# Patient Record
Sex: Male | Born: 1950 | Hispanic: Yes | Marital: Married | Smoking: Light tobacco smoker
Health system: Southern US, Community
[De-identification: ages and names within clinical notes are randomized; demographics above are authoritative.]

## PROBLEM LIST (undated history)

## (undated) DIAGNOSIS — K219 Gastro-esophageal reflux disease without esophagitis: Secondary | ICD-10-CM

## (undated) DIAGNOSIS — M1990 Unspecified osteoarthritis, unspecified site: Secondary | ICD-10-CM

## (undated) HISTORY — PX: TOTAL HIP ARTHROPLASTY: SHX124

## (undated) HISTORY — PX: POLYPECTOMY: SHX149

## (undated) HISTORY — PX: COLONOSCOPY: SHX174

## (undated) HISTORY — PX: KNEE ARTHROSCOPY: SHX127

## (undated) HISTORY — PX: TOTAL SHOULDER REPLACEMENT: SUR1217

---

## 2005-06-06 ENCOUNTER — Ambulatory Visit: Payer: Self-pay | Admitting: Nurse Practitioner

## 2005-06-12 ENCOUNTER — Ambulatory Visit: Payer: Self-pay | Admitting: Physician Assistant

## 2006-12-11 ENCOUNTER — Ambulatory Visit: Payer: Self-pay | Admitting: Neurology

## 2007-01-07 ENCOUNTER — Ambulatory Visit: Payer: Self-pay | Admitting: Unknown Physician Specialty

## 2008-08-15 ENCOUNTER — Ambulatory Visit: Payer: Self-pay

## 2008-10-10 ENCOUNTER — Ambulatory Visit: Payer: Self-pay | Admitting: Gastroenterology

## 2008-10-17 ENCOUNTER — Ambulatory Visit: Payer: Self-pay | Admitting: Gastroenterology

## 2009-07-23 ENCOUNTER — Ambulatory Visit: Payer: Self-pay | Admitting: Cardiology

## 2009-08-24 ENCOUNTER — Ambulatory Visit: Payer: Self-pay | Admitting: Internal Medicine

## 2010-12-04 ENCOUNTER — Ambulatory Visit: Payer: Self-pay | Admitting: Internal Medicine

## 2010-12-27 ENCOUNTER — Ambulatory Visit: Payer: Self-pay

## 2011-01-23 ENCOUNTER — Ambulatory Visit: Payer: Self-pay | Admitting: Pain Medicine

## 2011-02-10 ENCOUNTER — Ambulatory Visit: Payer: Self-pay | Admitting: Pain Medicine

## 2011-02-11 ENCOUNTER — Ambulatory Visit: Payer: Self-pay | Admitting: Pain Medicine

## 2011-03-06 ENCOUNTER — Ambulatory Visit: Payer: Self-pay | Admitting: Pain Medicine

## 2011-03-24 ENCOUNTER — Ambulatory Visit: Payer: Self-pay | Admitting: Pain Medicine

## 2011-04-08 ENCOUNTER — Ambulatory Visit: Payer: Self-pay | Admitting: Internal Medicine

## 2011-05-14 HISTORY — PX: LUMBAR DISC SURGERY: SHX700

## 2011-06-11 ENCOUNTER — Ambulatory Visit: Payer: Self-pay | Admitting: Unknown Physician Specialty

## 2011-06-13 ENCOUNTER — Inpatient Hospital Stay: Payer: Self-pay | Admitting: Unknown Physician Specialty

## 2013-01-05 ENCOUNTER — Ambulatory Visit: Payer: Self-pay | Admitting: Internal Medicine

## 2013-02-04 ENCOUNTER — Ambulatory Visit: Payer: Self-pay | Admitting: Gastroenterology

## 2013-08-04 ENCOUNTER — Ambulatory Visit: Payer: Self-pay | Admitting: Rheumatology

## 2014-01-12 ENCOUNTER — Ambulatory Visit: Payer: Self-pay | Admitting: Gastroenterology

## 2016-01-03 ENCOUNTER — Other Ambulatory Visit: Payer: Self-pay | Admitting: Internal Medicine

## 2016-01-03 DIAGNOSIS — M5137 Other intervertebral disc degeneration, lumbosacral region: Secondary | ICD-10-CM

## 2016-01-22 NOTE — Discharge Instructions (Signed)

## 2016-01-23 ENCOUNTER — Ambulatory Visit
Admission: RE | Admit: 2016-01-23 | Discharge: 2016-01-23 | Disposition: A | Discharge status: Home / Self Care | Payer: Medicare Other | Source: Ambulatory Visit | Attending: Internal Medicine | Admitting: Internal Medicine

## 2016-01-23 ENCOUNTER — Ambulatory Visit: Admission: RE | Admit: 2016-01-23 | Payer: Medicare Other | Source: Ambulatory Visit | Admitting: Surgery

## 2016-01-23 ENCOUNTER — Encounter: Admission: RE | Payer: Self-pay | Source: Ambulatory Visit

## 2016-01-23 DIAGNOSIS — M5137 Other intervertebral disc degeneration, lumbosacral region: Secondary | ICD-10-CM | POA: Diagnosis not present

## 2016-01-23 DIAGNOSIS — M4806 Spinal stenosis, lumbar region: Secondary | ICD-10-CM | POA: Insufficient documentation

## 2016-01-23 DIAGNOSIS — M2548 Effusion, other site: Secondary | ICD-10-CM | POA: Diagnosis not present

## 2016-01-23 DIAGNOSIS — M5126 Other intervertebral disc displacement, lumbar region: Secondary | ICD-10-CM | POA: Insufficient documentation

## 2016-01-23 DIAGNOSIS — Z9889 Other specified postprocedural states: Secondary | ICD-10-CM | POA: Insufficient documentation

## 2016-01-23 SURGERY — RELEASE, DUPUYTREN CONTRACTURE
Anesthesia: Choice | Laterality: Right

## 2017-02-13 ENCOUNTER — Other Ambulatory Visit: Payer: Self-pay | Admitting: Internal Medicine

## 2017-02-13 DIAGNOSIS — M7581 Other shoulder lesions, right shoulder: Secondary | ICD-10-CM

## 2017-02-13 DIAGNOSIS — M19011 Primary osteoarthritis, right shoulder: Secondary | ICD-10-CM

## 2017-02-25 ENCOUNTER — Ambulatory Visit
Admission: RE | Admit: 2017-02-25 | Discharge: 2017-02-25 | Disposition: A | Discharge status: Home / Self Care | Payer: Medicare Other | Source: Ambulatory Visit | Attending: Internal Medicine | Admitting: Internal Medicine

## 2017-02-25 DIAGNOSIS — M7551 Bursitis of right shoulder: Secondary | ICD-10-CM | POA: Insufficient documentation

## 2017-02-25 DIAGNOSIS — M19011 Primary osteoarthritis, right shoulder: Secondary | ICD-10-CM | POA: Diagnosis not present

## 2017-02-25 DIAGNOSIS — M7581 Other shoulder lesions, right shoulder: Secondary | ICD-10-CM | POA: Diagnosis not present

## 2017-02-25 DIAGNOSIS — M25411 Effusion, right shoulder: Secondary | ICD-10-CM | POA: Insufficient documentation

## 2017-02-25 DIAGNOSIS — M659 Synovitis and tenosynovitis, unspecified: Secondary | ICD-10-CM | POA: Diagnosis not present

## 2017-03-19 ENCOUNTER — Encounter
Admission: RE | Admit: 2017-03-19 | Discharge: 2017-03-19 | Disposition: A | Discharge status: Home / Self Care | Payer: Medicare Other | Source: Ambulatory Visit | Attending: Surgery | Admitting: Surgery

## 2017-03-19 DIAGNOSIS — M19011 Primary osteoarthritis, right shoulder: Secondary | ICD-10-CM | POA: Diagnosis not present

## 2017-03-19 DIAGNOSIS — Z01812 Encounter for preprocedural laboratory examination: Secondary | ICD-10-CM | POA: Insufficient documentation

## 2017-03-19 DIAGNOSIS — Z01818 Encounter for other preprocedural examination: Secondary | ICD-10-CM | POA: Insufficient documentation

## 2017-03-19 DIAGNOSIS — Z0183 Encounter for blood typing: Secondary | ICD-10-CM | POA: Insufficient documentation

## 2017-03-19 DIAGNOSIS — R001 Bradycardia, unspecified: Secondary | ICD-10-CM | POA: Diagnosis not present

## 2017-03-19 HISTORY — DX: Gastro-esophageal reflux disease without esophagitis: K21.9

## 2017-03-19 HISTORY — DX: Unspecified osteoarthritis, unspecified site: M19.90

## 2017-03-19 LAB — CBC
HEMATOCRIT: 37.2 % — AB (ref 40.0–52.0)
Hemoglobin: 13.3 g/dL (ref 13.0–18.0)
MCH: 31.9 pg (ref 26.0–34.0)
MCHC: 35.7 g/dL (ref 32.0–36.0)
MCV: 89.3 fL (ref 80.0–100.0)
Platelets: 227 10*3/uL (ref 150–440)
RBC: 4.17 MIL/uL — ABNORMAL LOW (ref 4.40–5.90)
RDW: 13.5 % (ref 11.5–14.5)
WBC: 4.6 10*3/uL (ref 3.8–10.6)

## 2017-03-19 LAB — TYPE AND SCREEN
ABO/RH(D): B NEG
Antibody Screen: NEGATIVE

## 2017-03-19 LAB — BASIC METABOLIC PANEL
Anion gap: 6 (ref 5–15)
BUN: 23 mg/dL — ABNORMAL HIGH (ref 6–20)
CALCIUM: 9 mg/dL (ref 8.9–10.3)
CHLORIDE: 106 mmol/L (ref 101–111)
CO2: 29 mmol/L (ref 22–32)
CREATININE: 0.65 mg/dL (ref 0.61–1.24)
GFR calc Af Amer: >60 mL/min (ref >60)
GFR calc non Af Amer: >60 mL/min (ref >60)
GLUCOSE: 85 mg/dL (ref 65–99)
Potassium: 4.4 mmol/L (ref 3.5–5.1)
Sodium: 141 mmol/L (ref 135–145)

## 2017-03-19 LAB — URINALYSIS, COMPLETE (UACMP) WITH MICROSCOPIC
Bacteria, UA: NONE SEEN
GLUCOSE, UA: NEGATIVE mg/dL
Hgb urine dipstick: NEGATIVE
KETONES UR: 5 mg/dL — AB
Leukocytes, UA: NEGATIVE
Nitrite: NEGATIVE
Protein, ur: NEGATIVE mg/dL
RBC / HPF: NONE SEEN RBC/hpf (ref 0–5)
SQUAMOUS EPITHELIAL / LPF: NONE SEEN
Specific Gravity, Urine: 1.036 — ABNORMAL HIGH (ref 1.005–1.030)
pH: 5 (ref 5.0–8.0)

## 2017-03-19 LAB — PROTIME-INR
INR: 1.02
Prothrombin Time: 13.4 seconds (ref 11.4–15.2)

## 2017-03-19 LAB — SURGICAL PCR SCREEN

## 2017-03-19 NOTE — Patient Instructions (Signed)
Your procedure is scheduled on: 03/26/17 Thurs Report to Same Day Surgery 2nd floor medical mall [facility](Medical Mall Entrance-take elevator on left to 2nd floor.  Check in with surgery information desk.) To find out your arrival time please call [phone] between 1PM - 3PM on 03/25/17 Wed Remember: Instructions that are not followed completely may result in serious medical risk, up to and including death, or upon the discretion of your surgeon and anesthesiologist your surgery may need to be rescheduled.    _x___ 1. Do not eat food or drink liquids after midnight. No gum chewing or                              hard candies.     __x__ 2. No Alcohol for 24 hours before or after surgery.   __x__3. No Smoking for 24 prior to surgery.   ____  4. Bring all medications with you on the day of surgery if instructed.    __x__ 5. Notify your doctor if there is any change in your medical condition     (cold, fever, infections).     Do not wear jewelry, make-up, hairpins, clips or nail polish.  Do not wear lotions, powders, or perfumes. You may wear deodorant.  Do not shave 48 hours prior to surgery. Men may shave face and neck.  Do not bring valuables to the hospital.    [facility] any belongings or valuables.               Contacts, dentures or bridgework may not be worn into surgery.  Leave your suitcase in the car. After surgery it may be brought to your room.  For patients admitted to the hospital, discharge time is determined by your                       treatment team.   Patients discharged the day of surgery will not be allowed to drive home.  You will need someone to drive you home and stay with you the night of your procedure.    Please read over the following fact sheets that you were given:   [facility] Pre[facility] and or MRSA Information   _x___ Take anti-hypertensive (unless it includes a diuretic), cardiac, seizure, asthma,     anti-reflux and  psychiatric medicines. These include:  1. gabapentin (NEURONTIN)  2.HYDROcodone-acetaminophen (NORCO  3.omeprazole (PRILOSEC)   4.  5.  6.  ____Fleets enema or Magnesium Citrate as directed.   _x___ Use CHG Soap or sage wipes as directed on instruction sheet   ____ Use inhalers on the day of surgery and bring to hospital day of surgery  ____ Stop Metformin and Janumet 2 days prior to surgery.    ____ Take 1/2 of usual insulin dose the night before surgery and none on the morning     surgery.   _x___ Follow recommendations from Cardiologist, Pulmonologist or PCP regarding          stopping Aspirin, Coumadin, Pllavix ,Eliquis, Effient, or Pradaxa, and Pletal.  X____Stop Anti-inflammatories such as Advil, Aleve, Ibuprofen, Motrin, Naproxen, Naprosyn, Goodies powders or aspirin products.  Stop Meloxicam today OK to take Tylenol and                          Celebrex.   _x___ Stop supplements until after surgery.  But may continue Vitamin D, Vitamin B,       and multivitamin.   ____ Bring C-Pap to the hospital.

## 2017-03-20 LAB — URINE CULTURE: Culture: NO GROWTH

## 2017-03-25 MED ORDER — CEFAZOLIN SODIUM-DEXTROSE 2-4 GM/100ML-% IV SOLN
2.0000 g | Freq: Once | INTRAVENOUS | Status: AC
  Administered 2017-03-26: 2 g via INTRAVENOUS

## 2017-03-26 ENCOUNTER — Inpatient Hospital Stay: Payer: Medicare Other | Admitting: Anesthesiology

## 2017-03-26 ENCOUNTER — Encounter: Payer: Self-pay | Admitting: *Deleted

## 2017-03-26 ENCOUNTER — Inpatient Hospital Stay: Payer: Medicare Other

## 2017-03-26 ENCOUNTER — Inpatient Hospital Stay
Admission: RE | Admit: 2017-03-26 | Discharge: 2017-03-27 | DRG: 483 | Disposition: A | Discharge status: Home / Self Care | Payer: Medicare Other | Source: Ambulatory Visit | Attending: Surgery | Admitting: Surgery

## 2017-03-26 ENCOUNTER — Encounter
Admission: RE | Disposition: A | Discharge status: Home / Self Care | Payer: Self-pay | Source: Ambulatory Visit | Attending: Surgery

## 2017-03-26 DIAGNOSIS — F17210 Nicotine dependence, cigarettes, uncomplicated: Secondary | ICD-10-CM | POA: Diagnosis present

## 2017-03-26 DIAGNOSIS — M720 Palmar fascial fibromatosis [Dupuytren]: Secondary | ICD-10-CM | POA: Diagnosis present

## 2017-03-26 DIAGNOSIS — M19011 Primary osteoarthritis, right shoulder: Principal | ICD-10-CM | POA: Diagnosis present

## 2017-03-26 DIAGNOSIS — Z79899 Other long term (current) drug therapy: Secondary | ICD-10-CM | POA: Diagnosis not present

## 2017-03-26 DIAGNOSIS — Z96611 Presence of right artificial shoulder joint: Secondary | ICD-10-CM

## 2017-03-26 DIAGNOSIS — K219 Gastro-esophageal reflux disease without esophagitis: Secondary | ICD-10-CM | POA: Diagnosis present

## 2017-03-26 HISTORY — PX: DUPUYTREN CONTRACTURE RELEASE: SHX1478

## 2017-03-26 HISTORY — PX: TOTAL SHOULDER ARTHROPLASTY: SHX126

## 2017-03-26 LAB — ABO/RH: ABO/RH(D): B NEG

## 2017-03-26 LAB — SURGICAL PCR SCREEN
MRSA, PCR: NEGATIVE
Staphylococcus aureus: POSITIVE — AB

## 2017-03-26 SURGERY — ARTHROPLASTY, SHOULDER, TOTAL
Anesthesia: Regional | Site: Shoulder | Laterality: Right

## 2017-03-26 MED ORDER — ONDANSETRON HCL 4 MG/2ML IJ SOLN
INTRAMUSCULAR | Status: AC
  Filled 2017-03-26: qty 2

## 2017-03-26 MED ORDER — ROPIVACAINE HCL 5 MG/ML IJ SOLN
INTRAMUSCULAR | Status: AC
  Filled 2017-03-26: qty 30

## 2017-03-26 MED ORDER — DEXAMETHASONE SODIUM PHOSPHATE 10 MG/ML IJ SOLN
INTRAMUSCULAR | Status: AC
  Filled 2017-03-26: qty 1

## 2017-03-26 MED ORDER — LACTATED RINGERS IV SOLN
INTRAVENOUS | Status: DC
  Administered 2017-03-26 (×3): via INTRAVENOUS

## 2017-03-26 MED ORDER — DOCUSATE SODIUM 100 MG PO CAPS
100.0000 mg | ORAL_CAPSULE | Freq: Two times a day (BID) | ORAL | Status: DC
  Administered 2017-03-26 – 2017-03-27 (×2): 100 mg via ORAL
  Filled 2017-03-26 (×2): qty 1

## 2017-03-26 MED ORDER — KETOROLAC TROMETHAMINE 15 MG/ML IJ SOLN
15.0000 mg | Freq: Four times a day (QID) | INTRAMUSCULAR | Status: DC
  Administered 2017-03-26 – 2017-03-27 (×3): 15 mg via INTRAVENOUS
  Filled 2017-03-26 (×3): qty 1

## 2017-03-26 MED ORDER — EPINEPHRINE PF 1 MG/ML IJ SOLN
INTRAMUSCULAR | Status: AC
  Filled 2017-03-26: qty 1

## 2017-03-26 MED ORDER — BUPIVACAINE HCL (PF) 0.5 % IJ SOLN
INTRAMUSCULAR | Status: AC
Start: 2017-03-26 — End: 2017-03-26
  Filled 2017-03-26: qty 30

## 2017-03-26 MED ORDER — ONDANSETRON HCL 4 MG/2ML IJ SOLN: 4.0000 mg | Freq: Once | INTRAMUSCULAR | Status: DC | PRN

## 2017-03-26 MED ORDER — BUPIVACAINE-EPINEPHRINE (PF) 0.5% -1:200000 IJ SOLN
INTRAMUSCULAR | Status: AC
  Filled 2017-03-26: qty 30

## 2017-03-26 MED ORDER — ACETAMINOPHEN 10 MG/ML IV SOLN
INTRAVENOUS | Status: DC | PRN
  Administered 2017-03-26: 1000 mg via INTRAVENOUS

## 2017-03-26 MED ORDER — KETOROLAC TROMETHAMINE 30 MG/ML IJ SOLN
INTRAMUSCULAR | Status: AC
  Filled 2017-03-26: qty 1

## 2017-03-26 MED ORDER — MIDAZOLAM HCL 2 MG/2ML IJ SOLN
INTRAMUSCULAR | Status: AC
  Filled 2017-03-26: qty 2

## 2017-03-26 MED ORDER — BUPIVACAINE HCL (PF) 0.5 % IJ SOLN
INTRAMUSCULAR | Status: DC | PRN
  Administered 2017-03-26: 10 mL

## 2017-03-26 MED ORDER — KCL IN DEXTROSE-NACL 20-5-0.9 MEQ/L-%-% IV SOLN
INTRAVENOUS | Status: DC
  Administered 2017-03-26 – 2017-03-27 (×2): via INTRAVENOUS
  Filled 2017-03-26 (×4): qty 1000

## 2017-03-26 MED ORDER — MAGNESIUM HYDROXIDE 400 MG/5ML PO SUSP: 30.0000 mL | Freq: Every day | ORAL | Status: DC | PRN

## 2017-03-26 MED ORDER — ROPIVACAINE HCL 5 MG/ML IJ SOLN
INTRAMUSCULAR | Status: DC | PRN
  Administered 2017-03-26: 30 mL via PERINEURAL

## 2017-03-26 MED ORDER — FENTANYL CITRATE (PF) 100 MCG/2ML IJ SOLN
INTRAMUSCULAR | Status: AC
  Filled 2017-03-26: qty 2

## 2017-03-26 MED ORDER — LIDOCAINE HCL (PF) 2 % IJ SOLN
INTRAMUSCULAR | Status: AC
Start: 2017-03-26 — End: 2017-03-26
  Filled 2017-03-26: qty 2

## 2017-03-26 MED ORDER — SODIUM CHLORIDE 0.9 % IJ SOLN
INTRAMUSCULAR | Status: AC
  Filled 2017-03-26: qty 50

## 2017-03-26 MED ORDER — LIDOCAINE HCL (CARDIAC) 20 MG/ML IV SOLN
INTRAVENOUS | Status: DC | PRN
  Administered 2017-03-26: 40 mg via INTRAVENOUS

## 2017-03-26 MED ORDER — ACETAMINOPHEN 500 MG PO TABS
1000.0000 mg | ORAL_TABLET | Freq: Four times a day (QID) | ORAL | Status: DC
  Administered 2017-03-27 (×2): 1000 mg via ORAL
  Filled 2017-03-26 (×2): qty 2

## 2017-03-26 MED ORDER — FENTANYL CITRATE (PF) 250 MCG/5ML IJ SOLN
INTRAMUSCULAR | Status: AC
  Filled 2017-03-26: qty 5

## 2017-03-26 MED ORDER — FERROUS SULFATE 325 (65 FE) MG PO TABS
325.0000 mg | ORAL_TABLET | Freq: Every day | ORAL | Status: DC
  Administered 2017-03-27: 325 mg via ORAL
  Filled 2017-03-26 (×2): qty 1

## 2017-03-26 MED ORDER — ROCURONIUM BROMIDE 100 MG/10ML IV SOLN
INTRAVENOUS | Status: DC | PRN
  Administered 2017-03-26: 10 mg via INTRAVENOUS
  Administered 2017-03-26: 50 mg via INTRAVENOUS
  Administered 2017-03-26: 15 mg via INTRAVENOUS
  Administered 2017-03-26: 20 mg via INTRAVENOUS

## 2017-03-26 MED ORDER — HYDROMORPHONE HCL 1 MG/ML IJ SOLN: 1.0000 mg | INTRAMUSCULAR | Status: DC | PRN

## 2017-03-26 MED ORDER — GABAPENTIN 300 MG PO CAPS
300.0000 mg | ORAL_CAPSULE | Freq: Three times a day (TID) | ORAL | Status: DC
  Administered 2017-03-26 – 2017-03-27 (×2): 300 mg via ORAL
  Filled 2017-03-26 (×2): qty 1

## 2017-03-26 MED ORDER — ENOXAPARIN SODIUM 40 MG/0.4ML ~~LOC~~ SOLN
40.0000 mg | SUBCUTANEOUS | Status: DC
  Administered 2017-03-27: 40 mg via SUBCUTANEOUS
  Filled 2017-03-26: qty 0.4

## 2017-03-26 MED ORDER — ACETAMINOPHEN 10 MG/ML IV SOLN
INTRAVENOUS | Status: AC
  Filled 2017-03-26: qty 100

## 2017-03-26 MED ORDER — LIDOCAINE HCL (PF) 1 % IJ SOLN
INTRAMUSCULAR | Status: AC
  Filled 2017-03-26: qty 5

## 2017-03-26 MED ORDER — ONDANSETRON HCL 4 MG/2ML IJ SOLN
INTRAMUSCULAR | Status: DC | PRN
  Administered 2017-03-26: 4 mg via INTRAVENOUS

## 2017-03-26 MED ORDER — KETOROLAC TROMETHAMINE 30 MG/ML IJ SOLN
30.0000 mg | Freq: Once | INTRAMUSCULAR | Status: AC
  Administered 2017-03-26: 30 mg via INTRAVENOUS

## 2017-03-26 MED ORDER — VITAMIN D 1000 UNITS PO TABS
2000.0000 [IU] | ORAL_TABLET | ORAL | Status: DC
  Administered 2017-03-27: 2000 [IU] via ORAL
  Filled 2017-03-26: qty 2

## 2017-03-26 MED ORDER — BUPIVACAINE-EPINEPHRINE (PF) 0.5% -1:200000 IJ SOLN
INTRAMUSCULAR | Status: DC | PRN
Start: 2017-03-26 — End: 2017-03-26
  Administered 2017-03-26: 30 mL

## 2017-03-26 MED ORDER — SUGAMMADEX SODIUM 200 MG/2ML IV SOLN
INTRAVENOUS | Status: DC | PRN
  Administered 2017-03-26: 160 mg via INTRAVENOUS

## 2017-03-26 MED ORDER — PROPOFOL 10 MG/ML IV BOLUS
INTRAVENOUS | Status: DC | PRN
  Administered 2017-03-26: 110 mg via INTRAVENOUS

## 2017-03-26 MED ORDER — CEFAZOLIN SODIUM-DEXTROSE 2-4 GM/100ML-% IV SOLN
INTRAVENOUS | Status: AC
  Filled 2017-03-26: qty 100

## 2017-03-26 MED ORDER — FENTANYL CITRATE (PF) 100 MCG/2ML IJ SOLN: 25.0000 ug | INTRAMUSCULAR | Status: DC | PRN

## 2017-03-26 MED ORDER — PROPOFOL 10 MG/ML IV BOLUS
INTRAVENOUS | Status: AC
  Filled 2017-03-26: qty 20

## 2017-03-26 MED ORDER — DIPHENHYDRAMINE HCL 12.5 MG/5ML PO ELIX: 12.5000 mg | ORAL_SOLUTION | ORAL | Status: DC | PRN

## 2017-03-26 MED ORDER — BUPIVACAINE LIPOSOME 1.3 % IJ SUSP
INTRAMUSCULAR | Status: AC
  Filled 2017-03-26: qty 20

## 2017-03-26 MED ORDER — ACETAMINOPHEN 650 MG RE SUPP: 650.0000 mg | Freq: Four times a day (QID) | RECTAL | Status: DC | PRN

## 2017-03-26 MED ORDER — FLEET ENEMA 7-19 GM/118ML RE ENEM: 1.0000 | ENEMA | Freq: Once | RECTAL | Status: DC | PRN

## 2017-03-26 MED ORDER — DEXAMETHASONE SODIUM PHOSPHATE 10 MG/ML IJ SOLN
INTRAMUSCULAR | Status: DC | PRN
  Administered 2017-03-26: 8 mg via INTRAVENOUS

## 2017-03-26 MED ORDER — SUGAMMADEX SODIUM 200 MG/2ML IV SOLN
INTRAVENOUS | Status: AC
  Filled 2017-03-26: qty 2

## 2017-03-26 MED ORDER — PANTOPRAZOLE SODIUM 40 MG PO TBEC
40.0000 mg | DELAYED_RELEASE_TABLET | Freq: Every day | ORAL | Status: DC
  Administered 2017-03-26 – 2017-03-27 (×2): 40 mg via ORAL
  Filled 2017-03-26 (×2): qty 1

## 2017-03-26 MED ORDER — MIDAZOLAM HCL 5 MG/5ML IJ SOLN
INTRAMUSCULAR | Status: AC
  Filled 2017-03-26: qty 5

## 2017-03-26 MED ORDER — LIDOCAINE HCL (PF) 1 % IJ SOLN
INTRAMUSCULAR | Status: DC | PRN
  Administered 2017-03-26: .5 mL via SUBCUTANEOUS

## 2017-03-26 MED ORDER — CEFAZOLIN SODIUM-DEXTROSE 2-4 GM/100ML-% IV SOLN
2.0000 g | Freq: Four times a day (QID) | INTRAVENOUS | Status: AC
  Administered 2017-03-26 – 2017-03-27 (×3): 2 g via INTRAVENOUS
  Filled 2017-03-26 (×3): qty 100

## 2017-03-26 MED ORDER — ONDANSETRON HCL 4 MG/2ML IJ SOLN: 4.0000 mg | Freq: Four times a day (QID) | INTRAMUSCULAR | Status: DC | PRN

## 2017-03-26 MED ORDER — BUPIVACAINE LIPOSOME 1.3 % IJ SUSP
INTRAMUSCULAR | Status: DC | PRN
  Administered 2017-03-26: 20 mL

## 2017-03-26 MED ORDER — FENTANYL CITRATE (PF) 100 MCG/2ML IJ SOLN
INTRAMUSCULAR | Status: DC | PRN
  Administered 2017-03-26 (×3): 50 ug via INTRAVENOUS
  Administered 2017-03-26: 100 ug via INTRAVENOUS
  Administered 2017-03-26 (×2): 25 ug via INTRAVENOUS

## 2017-03-26 MED ORDER — ROCURONIUM BROMIDE 50 MG/5ML IV SOLN
INTRAVENOUS | Status: AC
  Filled 2017-03-26: qty 1

## 2017-03-26 MED ORDER — ONDANSETRON HCL 4 MG PO TABS: 4.0000 mg | ORAL_TABLET | Freq: Four times a day (QID) | ORAL | Status: DC | PRN

## 2017-03-26 MED ORDER — PHENYLEPHRINE HCL 10 MG/ML IJ SOLN
INTRAMUSCULAR | Status: AC
  Filled 2017-03-26: qty 1

## 2017-03-26 MED ORDER — VITAMIN B-12 1000 MCG PO TABS
1000.0000 ug | ORAL_TABLET | Freq: Every day | ORAL | Status: DC
  Administered 2017-03-27: 1000 ug via ORAL
  Filled 2017-03-26: qty 1

## 2017-03-26 MED ORDER — ACETAMINOPHEN 325 MG PO TABS: 650.0000 mg | ORAL_TABLET | Freq: Four times a day (QID) | ORAL | Status: DC | PRN

## 2017-03-26 MED ORDER — FENTANYL CITRATE (PF) 100 MCG/2ML IJ SOLN
25.0000 ug | INTRAMUSCULAR | Status: DC | PRN
  Administered 2017-03-26 (×4): 25 ug via INTRAVENOUS

## 2017-03-26 MED ORDER — MIDAZOLAM HCL 2 MG/2ML IJ SOLN
2.0000 mg | Freq: Once | INTRAMUSCULAR | Status: AC
  Administered 2017-03-26: 2 mg via INTRAVENOUS

## 2017-03-26 MED ORDER — METOCLOPRAMIDE HCL 10 MG PO TABS: 5.0000 mg | ORAL_TABLET | Freq: Three times a day (TID) | ORAL | Status: DC | PRN

## 2017-03-26 MED ORDER — PHENYLEPHRINE HCL 10 MG/ML IJ SOLN
INTRAMUSCULAR | Status: DC | PRN
  Administered 2017-03-26: 2 mL via TOPICAL

## 2017-03-26 MED ORDER — EPHEDRINE SULFATE 50 MG/ML IJ SOLN
INTRAMUSCULAR | Status: DC | PRN
  Administered 2017-03-26: 10 mg via INTRAVENOUS
  Administered 2017-03-26: 15 mg via INTRAVENOUS

## 2017-03-26 MED ORDER — NEOMYCIN-POLYMYXIN B GU 40-200000 IR SOLN
Status: AC
  Filled 2017-03-26: qty 20

## 2017-03-26 MED ORDER — BISACODYL 10 MG RE SUPP: 10.0000 mg | Freq: Every day | RECTAL | Status: DC | PRN

## 2017-03-26 MED ORDER — FENTANYL CITRATE (PF) 100 MCG/2ML IJ SOLN
50.0000 ug | Freq: Once | INTRAMUSCULAR | Status: AC
  Administered 2017-03-26: 50 ug via INTRAVENOUS

## 2017-03-26 MED ORDER — NEOMYCIN-POLYMYXIN B GU 40-200000 IR SOLN
Status: DC | PRN
  Administered 2017-03-26: 14 mL

## 2017-03-26 MED ORDER — METOCLOPRAMIDE HCL 5 MG/ML IJ SOLN: 5.0000 mg | Freq: Three times a day (TID) | INTRAMUSCULAR | Status: DC | PRN

## 2017-03-26 MED ORDER — OXYCODONE HCL 5 MG PO TABS
5.0000 mg | ORAL_TABLET | ORAL | Status: DC | PRN
  Administered 2017-03-26 – 2017-03-27 (×3): 10 mg via ORAL
  Filled 2017-03-26 (×3): qty 2

## 2017-03-26 SURGICAL SUPPLY — 88 items
BAG DECANTER FOR FLEXI CONT (MISCELLANEOUS) ×4 IMPLANT
BANDAGE ACE 3X5.8 VEL STRL LF (GAUZE/BANDAGES/DRESSINGS) ×4 IMPLANT
BLADE SAGITTAL WIDE XTHICK NO (BLADE) ×4 IMPLANT
BLADE SURG 15 STRL LF DISP TIS (BLADE) ×2 IMPLANT
BLADE SURG 15 STRL SS (BLADE) ×2
BNDG ELASTIC 2X5.8 VLCR STR LF (GAUZE/BANDAGES/DRESSINGS) ×4 IMPLANT
BNDG ESMARK 4X12 TAN STRL LF (GAUZE/BANDAGES/DRESSINGS) ×4 IMPLANT
BONE CEMENT PALACOSE (Cement) ×4 IMPLANT
BOWL CEMENT MIX W/ADAPTER (MISCELLANEOUS) ×4 IMPLANT
CANISTER SUCT 1200ML W/VALVE (MISCELLANEOUS) ×4 IMPLANT
CANISTER SUCT 3000ML PPV (MISCELLANEOUS) ×8 IMPLANT
CATH TRAY METER 16FR LF (MISCELLANEOUS) ×4 IMPLANT
CEMENT BONE PALACOSE (Cement) ×2 IMPLANT
CHLORAPREP W/TINT 26ML (MISCELLANEOUS) ×8 IMPLANT
COOLER POLAR GLACIER W/PUMP (MISCELLANEOUS) ×4 IMPLANT
CORD BIP STRL DISP 12FT (MISCELLANEOUS) ×4 IMPLANT
DRAPE IMP U-DRAPE 54X76 (DRAPES) ×8 IMPLANT
DRAPE INCISE IOBAN 66X45 STRL (DRAPES) ×8 IMPLANT
DRAPE SHEET LG 3/4 BI-LAMINATE (DRAPES) ×8 IMPLANT
DRAPE SURG 17X11 SM STRL (DRAPES) ×4 IMPLANT
DRAPE TABLE BACK 80X90 (DRAPES) ×4 IMPLANT
DRSG OPSITE POSTOP 4X8 (GAUZE/BANDAGES/DRESSINGS) ×4 IMPLANT
ELECT CAUTERY BLADE 6.4 (BLADE) ×4 IMPLANT
ELECT REM PT RETURN 9FT ADLT (ELECTROSURGICAL) ×4
ELECTRODE REM PT RTRN 9FT ADLT (ELECTROSURGICAL) ×2 IMPLANT
FORCEPS JEWEL BIP 4-3/4 STR (INSTRUMENTS) ×4 IMPLANT
GAUZE PACK 2X3YD (MISCELLANEOUS) ×4 IMPLANT
GAUZE PETRO XEROFOAM 1X8 (MISCELLANEOUS) ×4 IMPLANT
GAUZE SPONGE 4X4 12PLY STRL (GAUZE/BANDAGES/DRESSINGS) ×4 IMPLANT
GAUZE STRETCH 2X75IN STRL (MISCELLANEOUS) ×4 IMPLANT
GLENOID PEGGED STRL MED 4MM (Orthopedic Implant) ×4 IMPLANT
GLENOID POST REGENREX HYBRID (Orthopedic Implant) ×4 IMPLANT
GLOVE BIO SURGEON STRL SZ7.5 (GLOVE) ×8 IMPLANT
GLOVE BIO SURGEON STRL SZ8 (GLOVE) ×8 IMPLANT
GLOVE BIOGEL PI IND STRL 8 (GLOVE) ×2 IMPLANT
GLOVE BIOGEL PI INDICATOR 8 (GLOVE) ×2
GLOVE INDICATOR 8.0 STRL GRN (GLOVE) ×4 IMPLANT
GOWN STRL REUS W/ TWL LRG LVL3 (GOWN DISPOSABLE) ×4 IMPLANT
GOWN STRL REUS W/ TWL XL LVL3 (GOWN DISPOSABLE) ×2 IMPLANT
GOWN STRL REUS W/TWL LRG LVL3 (GOWN DISPOSABLE) ×4
GOWN STRL REUS W/TWL XL LVL3 (GOWN DISPOSABLE) ×2
HEAD HUMERAL COMP STD (Orthopedic Implant) ×2 IMPLANT
HEAD MODULAR 50MMX21MMX57MM (Orthopedic Implant) ×2 IMPLANT
HOOD PEEL AWAY FLYTE STAYCOOL (MISCELLANEOUS) ×12 IMPLANT
HUMERAL HEAD COMP STD (Orthopedic Implant) ×4 IMPLANT
KIT RM TURNOVER STRD PROC AR (KITS) ×4 IMPLANT
KIT STABILIZATION SHOULDER (MISCELLANEOUS) ×4 IMPLANT
MASK FACE SPIDER DISP (MASK) ×4 IMPLANT
MODULAR HEAD 50MMX21MMX57MM (Orthopedic Implant) ×4 IMPLANT
NDL MAYO CATGUT SZ1 (NEEDLE)
NDL MAYO CATGUT SZ5 (NEEDLE)
NDL SUT 5 .5 CRC TPR PNT MAYO (NEEDLE) IMPLANT
NEEDLE 18GX1X1/2 (RX/OR ONLY) (NEEDLE) ×4 IMPLANT
NEEDLE MAYO CATGUT SZ1 (NEEDLE) IMPLANT
NEEDLE MAYO CATGUT SZ4 (NEEDLE) IMPLANT
NEEDLE SPNL 20GX3.5 QUINCKE YW (NEEDLE) ×4 IMPLANT
NS IRRIG 1000ML POUR BTL (IV SOLUTION) ×4 IMPLANT
NS IRRIG 500ML POUR BTL (IV SOLUTION) ×4 IMPLANT
PACK ARTHROSCOPY SHOULDER (MISCELLANEOUS) ×4 IMPLANT
PACK EXTREMITY ARMC (MISCELLANEOUS) ×4 IMPLANT
PAD PREP 24X41 OB/GYN DISP (PERSONAL CARE ITEMS) ×4 IMPLANT
PAD WRAPON POLAR SHDR UNIV (MISCELLANEOUS) ×2 IMPLANT
PIN HUMERAL STMN 3.2MMX9IN (INSTRUMENTS) ×4 IMPLANT
PULSAVAC PLUS IRRIG FAN TIP (DISPOSABLE) ×4
SLING ULTRA II M (MISCELLANEOUS) ×4 IMPLANT
SOL .9 NS 3000ML IRR  AL (IV SOLUTION) ×2
SOL .9 NS 3000ML IRR UROMATIC (IV SOLUTION) ×2 IMPLANT
SPONGE LAP 18X18 5 PK (GAUZE/BANDAGES/DRESSINGS) ×4 IMPLANT
STAPLER SKIN PROX 35W (STAPLE) ×4 IMPLANT
STEM HUMERAL STRL 12MMX83MM (Stem) ×4 IMPLANT
STOCKINETTE STRL 4IN 9604848 (GAUZE/BANDAGES/DRESSINGS) ×4 IMPLANT
STRAP SAFETY BODY (MISCELLANEOUS) ×4 IMPLANT
SUT ETHIBOND 0 MO6 C/R (SUTURE) ×4 IMPLANT
SUT FIBERWIRE #2 38 BLUE 1/2 (SUTURE) ×4
SUT PROLENE 4 0 PS 2 18 (SUTURE) ×12 IMPLANT
SUT TICRON 2-0 30IN 311381 (SUTURE) ×20 IMPLANT
SUT VIC AB 0 CT1 36 (SUTURE) ×8 IMPLANT
SUT VIC AB 2-0 CT1 27 (SUTURE) ×4
SUT VIC AB 2-0 CT1 TAPERPNT 27 (SUTURE) ×4 IMPLANT
SUT VIC AB 3-0 SH 27 (SUTURE) ×2
SUT VIC AB 3-0 SH 27X BRD (SUTURE) ×2 IMPLANT
SUTURE FIBERWR #2 38 BLUE 1/2 (SUTURE) ×2 IMPLANT
SYR 30ML LL (SYRINGE) ×8 IMPLANT
SYR TB 1ML LUER SLIP (SYRINGE) ×4 IMPLANT
SYRINGE 10CC LL (SYRINGE) ×4 IMPLANT
SYRINGE IRR TOOMEY STRL 70CC (SYRINGE) ×4 IMPLANT
TIP FAN IRRIG PULSAVAC PLUS (DISPOSABLE) ×2 IMPLANT
WRAPON POLAR PAD SHDR UNIV (MISCELLANEOUS) ×4

## 2017-03-26 NOTE — Anesthesia Post-op Follow-up Note (Cosign Needed)
Anesthesia QCDR form completed.        

## 2017-03-26 NOTE — Op Note (Signed)
03/26/2017  4:43 PM  Patient:   Bryan Gould  Pre-Op Diagnosis:   1. Degenerative joint disease, right shoulder.  2. Dupuytren's contracture, right little finger.  Post-Op Diagnosis:   Same.  Procedure:   1. Right total shoulder arthroplasty.  2. Excision of Dupuytren's contracture right little finger.  Surgeon:   Maryagnes Amos, MD  Assistant:   Horris Latino, PA-C  Anesthesia:   General endotracheal intubation with an interscalene block.  Findings:   As above. The rotator cuff was in satisfactory condition.  Complications:   None  EBL:  150 cc  Fluids:   1200 cc crystalloid  UOP:   None  TT:   46 minutes at 250 mmHg  Drains:   None  Closure:   Staples for shoulder, 4-0 Prolene interrupted sutures for right hand.  Implants:   Biomet Comprehensive system with a pressfit #12 mini-humeral stem, a 50 x 21 mm humeral head, and a medium cemented glenoid component with a Regenerex post.  Brief Clinical Note:   The patient is a 66 year old male with a long history of right shoulder pain. His symptoms have progressed despite medications, activity modification, etc. His history and examination are consistent with advanced degenerative joint disease of the right glenohumeral joint which was confirmed by plain radiographs. An MRI scan demonstrated that his rotator cuff was in satisfactory condition. The patient presents at this time for a right total shoulder arthroplasty. The patient also notes a several year history of progressive good contracture of the right little finger. His history and examination are consistent with a Dupuytren's contracture extending from the proximal palmar crease to the PIP flexion crease. He requests that this be surgically corrected at this time as well.  Procedure:   The patient underwent an interscalene block in the preoperative holding area by the anesthesiologist before he was brought into the operating room and lain in the supine position. The patient  then underwent general endotracheal intubation and anesthesia. It was elected to proceed with release of the Dupuytren's contracture first. The patient was left in the supine position with his right arm on a hand table. The right upper extremity was prepped with ChloraPrep solution before being draped sterilely. Preoperative antibiotics were administered. A timeout was performed to verify the appropriate surgical site before the limb was exsanguinated with an Esmarch and the tourniquet inflated to 250 mmHg. A Brunner type incision was made extending from the radial aspect of the PIP joint to the ulnar aspect of the MCP flexion crease then back radially obliquely to the proximal palmar crease before angling back ulnarly. The incision was carried down through the subcutaneous tissues to encounter the cord. This cord was carefully released from its proximal most extension and dissected out from proximal to distal, gradually extending the MCP joint of the right little finger. Care was taken to avoid damage to the digital neurovascular bundles as the cord was excised in its entirety. Following release of the cord, the little finger could be extended fully. The wound was copiously irrigated with sterile saline solution before being closed using 4-0 Prolene interrupted sutures. A total of 10 cc of 0.5% plain Sensorcaine was injected around the incision to help with postoperative analgesia. A sterile bulky dressing was applied to the hand.  Next, attention was directed to the right shoulder. The patient was placed in the beach chair position using the beach chair positioner. The right shoulder and upper extremity were prepped with ChloraPrep solution before being draped sterilely. Preoperative  antibiotics were administered. A standard anterior approach to the shoulder was made through an approximately 4-4.5 inch incision. The incision was carried down through the subcutaneous tissues to expose the deltopectoral fascia.  The interval between the deltoid and pectoralis muscles was identified and this plane developed, retracting the cephalic vein laterally with the deltoid muscle. The conjoined tendon was identified. The lateral margin was dissected and the Kolbel self-retraining retractor inserted. The "three sisters" were identified and cauterized. Bursal tissues were removed to improve visualization. The subscapularis tendon was released from its attachment to the lesser tuberosity 1 cm proximal to its insertion and several tagging sutures placed. The inferior capsule was released with care after identifying and protecting the axillary nerve. The proximal humeral cut was made at approximately 30 of retroversion using the extra-medullary guide.   Attention was directed to the glenoid. The labrum was debrided circumferentially before the center of the glenoid was marked with electrocautery. The small and medium sizers were positioned and it was elected to proceed with a medium glenoid component. The guidewire was drilled into the glenoid neck using the appropriate guide. After verifying its position, the glenoid was lightly reamed with the butterfly reamer before the centralizing Regenerex post reamer was used. The small peripheral peg guide was positioned and each of the three pegs drilled sequentially, leaving the preceding drill bit in place so as to minimize shifting of the guide. The bony surfaces were prepared for cementing by irrigating them thoroughly with bacitracin saline solution using the jet lavage system, then packing the glenoid with a Neo-Synephrine soaked sponge. Meanwhile, cement was mixed on the back table. When it was ready, some cement was injected into each of the three peg holes using a Toomey syringe and additional cement applied to the posterior aspect of the glenoid component. The component was impacted into place and the excess cement was removed using a [employer] house [employer] elevator. Pressure was maintained on the  glenoid until the cement hardened.  Attention was directed to the humeral side. The humeral canal was reamed sequentially beginning with the end-cutting reamer then progressing from a 4 mm reamer up to a 12 mm reamer. This provided excellent circumferential chatter. The canal was broached beginning with a #9 broach and progressing to a #12 broach. This was left in place and a trial reduction performed using the 50 x 21 mm trial humeral head. The arm demonstrated excellent range of motion as the hand could be brought across the chest to the opposite shoulder and brought to the top of the patient's head and to the patient's ear. The shoulder remained stable throughout this range of motion, and was stable with abduction and external rotation. The joint was dislocated and the trial components removed. The permanent #12 mini-stem was impacted into place with care taken to maintain the appropriate version. The permanent 50 x 21 mm humeral head with the standard taper set in the "C" position was put together on the back table and impacted into place. Again, the [facility] [employer] [employer] taper locking mechanism was verified using manual distraction. The shoulder was relocated and again placed through a range of motion with the findings as described above.  The wound was copiously irrigated with bacitracin saline solution using the jet lavage system before a total of 20 cc of Exparel diluted out to 60 cc with normal saline and 30 cc of 0.5% Sensorcaine with epinephrine was injected into the pericapsular and peri-incisional tissues to help with postoperative analgesia. The subscapularis tendon was reapproximated using #2  FiberWire interrupted sutures. The deltopectoral interval was closed using #0 Vicryl interrupted sutures before the subcutaneous tissues were closed using 2-0 Vicryl interrupted sutures. The skin was closed using staples. Prior to closing the skin, 1 g of transexemic acid in 10 cc of normal saline was injected  intra-articularly to help with postoperative bleeding. A sterile occlusive dressing was applied to the wound. At this point, the drapes were taken down and the right hand assessed. The dressing was dry and intact, so the hand was placed into a volar splint extending to the fingertips and keeping the wrist in neutral position and the fingers were full extension. A Polar Care device was positioned before the arm was placed into a shoulder immobilizer. The patient was then transferred back to a hospital bed before being awakened, extubated, and returned to the recovery room in satisfactory condition after tolerating the procedure well.

## 2017-03-26 NOTE — Anesthesia Procedure Notes (Signed)
Procedure Name: Intubation Date/Time: 03/26/2017 12:42 PM Performed by: Allean Found Pre-anesthesia Checklist: Patient identified, Emergency Drugs available, Suction available, Patient being monitored and Timeout performed Patient Re-evaluated:Patient Re-evaluated prior to inductionOxygen Delivery Method: Circle system utilized Preoxygenation: Pre-oxygenation with 100% oxygen Intubation Type: IV induction Ventilation: Mask ventilation without difficulty Laryngoscope Size: Mac and 3 Grade View: Grade I Tube type: Oral Tube size: 7.0 mm Number of attempts: 1 Airway Equipment and Method: Stylet Placement Confirmation: ETT inserted through vocal cords under direct vision,  positive ETCO2 and breath sounds checked- equal and bilateral Secured at: 19 cm Tube secured with: Tape Dental Injury: Teeth and Oropharynx as per pre-operative assessment

## 2017-03-26 NOTE — H&P (Signed)
Paper H&P to be scanned into permanent record. H&P reviewed and patient re-examined. No changes. 

## 2017-03-26 NOTE — Anesthesia Postprocedure Evaluation (Signed)
Anesthesia Post Note  Patient: Bryan Gould  Procedure(s) Performed: Procedure(s) (LRB): TOTAL SHOULDER ARTHROPLASTY (Right) DUPUYTREN CONTRACTURE RELEASE (Right)  Patient location during evaluation: PACU Anesthesia Type: Regional and General Level of consciousness: awake and alert Pain management: pain level controlled Vital Signs Assessment: post-procedure vital signs reviewed and stable Respiratory status: spontaneous breathing, nonlabored ventilation, respiratory function stable and patient connected to nasal cannula oxygen Cardiovascular status: blood pressure returned to baseline and stable Postop Assessment: no signs of nausea or vomiting Anesthetic complications: no     Last Vitals:  Vitals:   03/26/17 1840 03/26/17 1932  BP: 122/66 123/67  Pulse: 86 87  Resp: 18 19  Temp:  36.4 C    Last Pain:  Vitals:   03/26/17 1932  TempSrc: Oral  PainSc:     LLE Motor Response: Purposeful movement (03/26/17 1933) LLE Sensation: Full sensation (03/26/17 1933) RLE Motor Response: Purposeful movement (03/26/17 1933) RLE Sensation: Full sensation (03/26/17 1933)      Lenard SimmerAndrew Karmelo Bass

## 2017-03-26 NOTE — Anesthesia Preprocedure Evaluation (Signed)
Anesthesia Evaluation  Patient identified by MRN, date of birth, ID band Patient awake    Reviewed: Allergy & Precautions, NPO status , Patient's Chart, lab work & pertinent test results  History of Anesthesia Complications Negative for: history of anesthetic complications  Airway Mallampati: II       Dental  (+) Loose, Chipped   Pulmonary Current Smoker,           Cardiovascular (-) hypertension(-) Past MI and (-) CHF (-) dysrhythmias (-) Valvular Problems/Murmurs     Neuro/Psych neg Seizures    GI/Hepatic Neg liver ROS, GERD  Medicated,  Endo/Other  neg diabetes  Renal/GU negative Renal ROS     Musculoskeletal   Abdominal   Peds  Hematology   Anesthesia Other Findings   Reproductive/Obstetrics                            Anesthesia Physical Anesthesia Plan  ASA: II  Anesthesia Plan: General   Post-op Pain Management:  Regional for Post-op pain   Induction: Intravenous  PONV Risk Score and Plan: 1 and Ondansetron and Dexamethasone  Airway Management Planned: Oral ETT  Additional Equipment:   Intra-op Plan:   Post-operative Plan:   Informed Consent: I have reviewed the patients History and Physical, chart, labs and discussed the procedure including the risks, benefits and alternatives for the proposed anesthesia with the patient or authorized representative who has indicated his/her understanding and acceptance.     Plan Discussed with:   Anesthesia Plan Comments:         Anesthesia Quick Evaluation

## 2017-03-26 NOTE — Transfer of Care (Signed)
Immediate Anesthesia Transfer of Care Note  Patient: Bryan Gould  Procedure(s) Performed: Procedure(s): TOTAL SHOULDER ARTHROPLASTY (Right) DUPUYTREN CONTRACTURE RELEASE (Right)  Patient Location: PACU  Anesthesia Type:GA combined with regional for post-op pain  Level of Consciousness: awake and alert   Airway & Oxygen Therapy: Patient Spontanous Breathing and Patient connected to face mask oxygen  Post-op Assessment: Report given to RN and Post -op Vital signs reviewed and stable  Post vital signs: Reviewed  Last Vitals:  Vitals:   03/26/17 1641 03/26/17 1646  BP:  132/75  Pulse:  (!) 110  Resp:  (!) 25  Temp: 36.2 C 36.2 C    Last Pain:  Vitals:   03/26/17 1230  TempSrc:   PainSc: 0-No pain         Complications: No apparent anesthesia complications

## 2017-03-26 NOTE — Anesthesia Procedure Notes (Signed)
Anesthesia Regional Block: Interscalene brachial plexus block   Pre-Anesthetic Checklist: ,, timeout performed, Correct Patient, Correct Site, Correct Laterality, Correct Procedure, Correct Position, site marked, Risks and benefits discussed,  Surgical consent,  Pre-op evaluation,  At surgeon's request and post-op pain management  Laterality: Right  Prep: chloraprep       Needles:  Injection technique: Single-shot  Needle Type: Echogenic Stimulator Needle     Needle Length: 9cm  Needle Gauge: 22     Additional Needles:   Procedures: ultrasound guided, nerve stimulator,,,,,,   Nerve Stimulator or Paresthesia:  Response: triceps, 0.6 mA,   Additional Responses:   Narrative:  Start time: 03/26/2017 11:47 AM End time: 03/26/2017 11:54 AM Injection made incrementally with aspirations every 5 mL.  Performed by: Personally   Additional Notes: Functioning IV was confirmed and monitors were applied.  A 50mm 22ga Stimuplex needle was used. Sterile prep and drape,hand hygiene and sterile gloves were used.  Negative aspiration and negative test dose prior to incremental administration of local anesthetic. The patient tolerated the procedure well.

## 2017-03-27 ENCOUNTER — Encounter: Payer: Self-pay | Admitting: Surgery

## 2017-03-27 LAB — CBC WITH DIFFERENTIAL/PLATELET
Basophils Absolute: 0 10*3/uL (ref 0–0.1)
Basophils Relative: 0 %
EOS ABS: 0 10*3/uL (ref 0–0.7)
EOS PCT: 0 %
HCT: 37.3 % — ABNORMAL LOW (ref 40.0–52.0)
HEMOGLOBIN: 12.6 g/dL — AB (ref 13.0–18.0)
Lymphocytes Relative: 6 %
Lymphs Abs: 0.7 10*3/uL — ABNORMAL LOW (ref 1.0–3.6)
MCH: 30.3 pg (ref 26.0–34.0)
MCHC: 33.7 g/dL (ref 32.0–36.0)
MCV: 89.8 fL (ref 80.0–100.0)
Monocytes Absolute: 0.6 10*3/uL (ref 0.2–1.0)
Monocytes Relative: 5 %
NEUTROS PCT: 89 %
Neutro Abs: 10.2 10*3/uL — ABNORMAL HIGH (ref 1.4–6.5)
PLATELETS: 223 10*3/uL (ref 150–440)
RBC: 4.15 MIL/uL — AB (ref 4.40–5.90)
RDW: 14.1 % (ref 11.5–14.5)
WBC: 11.5 10*3/uL — AB (ref 3.8–10.6)

## 2017-03-27 LAB — BASIC METABOLIC PANEL
Anion gap: 4 — ABNORMAL LOW (ref 5–15)
BUN: 15 mg/dL (ref 6–20)
CHLORIDE: 106 mmol/L (ref 101–111)
CO2: 28 mmol/L (ref 22–32)
CREATININE: 0.59 mg/dL — AB (ref 0.61–1.24)
Calcium: 8.9 mg/dL (ref 8.9–10.3)
Glucose, Bld: 156 mg/dL — ABNORMAL HIGH (ref 65–99)
POTASSIUM: 4.1 mmol/L (ref 3.5–5.1)
SODIUM: 138 mmol/L (ref 135–145)

## 2017-03-27 MED ORDER — OXYCODONE HCL 5 MG PO TABS: 5.0000 mg | ORAL_TABLET | ORAL | 0 refills | Status: AC | PRN

## 2017-03-27 NOTE — Progress Notes (Signed)
  Subjective: 1 Day Post-Op Procedure(s) (LRB): TOTAL SHOULDER ARTHROPLASTY (Right) DUPUYTREN CONTRACTURE RELEASE (Right) Patient reports pain as mild.   Patient is well, and has had no acute complaints or problems Plan is to go Home after hospital stay. Negative for chest pain and shortness of breath Fever: no Gastrointestinal:Negative for nausea and vomiting  Objective: Vital signs in last 24 hours: Temp:  [97.1 F (36.2 C)-98 F (36.7 C)] 98 F (36.7 C) (06/15 0746) Pulse Rate:  [50-110] 58 (06/15 0746) Resp:  [12-25] 18 (06/15 0746) BP: (110-141)/(56-88) 116/71 (06/15 0746) SpO2:  [94 %-100 %] 99 % (06/15 0746) Weight:  [79.4 kg (175 lb)] 79.4 kg (175 lb) (06/14 1046)  Intake/Output from previous day:  Intake/Output Summary (Last 24 hours) at 03/27/17 0755 Last data filed at 03/27/17 0446  Gross per 24 hour  Intake             2745 ml  Output              730 ml  Net             2015 ml    Intake/Output this shift: No intake/output data recorded.  Labs:  Recent Labs  03/27/17 0354  HGB 12.6*    Recent Labs  03/27/17 0354  WBC 11.5*  RBC 4.15*  HCT 37.3*  PLT 223    Recent Labs  03/27/17 0354  NA 138  K 4.1  CL 106  CO2 28  BUN 15  CREATININE 0.59*  GLUCOSE 156*  CALCIUM 8.9   No results for input(s): LABPT, INR in the last 72 hours.   EXAM General - Patient is Alert, Appropriate and Oriented Extremity - ABD soft Incision: dressing C/D/I No cellulitis present  Intact to light touch over the right arm, including over the lateral deltoid. Able to flex and extend fingers on command. Cap refill intact. Dressing/Incision - clean, dry, no drainage Motor Function - intact, moving foot and toes well on exam.  Past Medical History:  Diagnosis Date  . Arthritis   . GERD (gastroesophageal reflux disease)     Assessment/Plan: 1 Day Post-Op Procedure(s) (LRB): TOTAL SHOULDER ARTHROPLASTY (Right) DUPUYTREN CONTRACTURE RELEASE (Right) Active  Problems:   Status post reverse total shoulder replacement, right  Estimated body mass index is 27.41 kg/m as calculated from the following:   Height as of this encounter: 5\' 7"  (1.702 m).   Weight as of this encounter: 79.4 kg (175 lb). Advance diet Up with therapy D/C IV fluids when tolerating po intake.  Labs reviewed. WBC 11.5 but no fevers. Up with therapy today, has urinated without the foley. Plan for discharge home this afternoon.  DVT Prophylaxis - Lovenox, Foot Pumps and TED hose Non-weightbearing to the right arm.  Valeria BatmanJ. Lance Shandale Malak, PA-C [facility] 03/27/2017, 7:55 AM

## 2017-03-27 NOTE — Discharge Summary (Signed)
Physician Discharge Summary  Patient ID: Bryan Gould MRN: [id number] DOB/AGE: [birth year] 66 y.o.  Admit date: 03/26/2017 Discharge date: 03/27/2017  Admission Diagnoses:  PRIMARY OSTEOARTHRITIS OF RIGHT SHOULDER Dupuytren's contracture of right little finger  Discharge Diagnoses: Patient Active Problem List   Diagnosis Date Noted  . 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 Arthritis   . GERD (gastroesophageal reflux disease)    Transfusion: None.   Consultants (if any):   Discharged Condition: Improved  Hospital Course: Bryan Gould is an 66 y.o. male who was admitted 03/26/2017 with a diagnosis of right shoulder degenerative joint disease and dupuytren's contracture of right little finger and went to the operating room on 03/26/2017 and underwent the above named procedures.    Surgeries: Procedure(s): TOTAL SHOULDER ARTHROPLASTY DUPUYTREN CONTRACTURE RELEASE on 03/26/2017 Patient tolerated the surgery well. Taken to PACU where she was stabilized and then transferred to the orthopedic floor.  Started on Lovenox 40mg  q 24 hrs. Foot pumps applied bilaterally at 80 mm. Heels elevated on bed with rolled towels. No evidence of DVT. Negative Homan. Physical therapy started on day #1 for gait training and transfer.  Patient's IV and foley were d/c on POD1.  Implants: Biomet Comprehensive system with a pressfit #12 mini-humeral stem, a 50 x 21 mm humeral head, and a medium cemented glenoid component with a Regenerex post.  He was given perioperative antibiotics:  Anti-infectives    Start     Dose/Rate Route Frequency Ordered Stop   03/26/17 1900  ceFAZolin (ANCEF) IVPB 2g/100 mL premix     2 g 200 mL/hr over 30 Minutes Intravenous Every 6 hours 03/26/17 1751 03/27/17 0626   03/26/17 1028  ceFAZolin (ANCEF) 2-4 GM/100ML-% IVPB    Comments:  KENNEDY, DENISE: cabinet  override      03/26/17 1028 03/26/17 1246   03/25/17 2215  ceFAZolin (ANCEF) IVPB 2g/100 mL premix     2 g 200 mL/hr over 30 Minutes Intravenous  Once 03/25/17 2207 03/26/17 1316    .  He was given sequential compression devices, early ambulation, and Lovenox for DVT prophylaxis.  He benefited maximally from the hospital stay and there were no complications.    Recent vital signs:  Vitals:   03/27/17 0413 03/27/17 0746  BP: 116/65 116/71  Pulse: (!) 56 (!) 58  Resp: 18 18  Temp: 97.8 F (36.6 C) 98 F (36.7 C)    Recent laboratory studies:  Lab Results  Component Value Date   HGB 12.6 (L) 03/27/2017   HGB 13.3 03/19/2017   Lab Results  Component Value Date   WBC 11.5 (H) 03/27/2017   PLT 223 03/27/2017   Lab Results  Component Value Date   INR 1.02 03/19/2017   Lab Results  Component Value Date   NA 138 03/27/2017   K 4.1 03/27/2017   CL 106 03/27/2017   CO2 28 03/27/2017   BUN 15 03/27/2017   CREATININE 0.59 (L) 03/27/2017   GLUCOSE 156 (H) 03/27/2017    Discharge Medications:   Allergies as of 03/27/2017   No Known Allergies     Medication List    STOP taking these medications   HYDROcodone-acetaminophen 10-325 MG tablet Commonly known as:  NORCO     TAKE these medications   ferrous sulfate 325 (65 FE) MG tablet Take 325 mg by mouth daily with breakfast.   gabapentin 300 MG capsule Commonly  known as:  NEURONTIN Take 300 mg by mouth 3 (three) times daily.   meloxicam 7.5 MG tablet Commonly known as:  MOBIC Take 7.5 mg by mouth 2 (two) times daily.   omeprazole 20 MG capsule Commonly known as:  PRILOSEC Take 20 mg by mouth 2 (two) times daily before a meal.   oxyCODONE 5 MG immediate release tablet Commonly known as:  Oxy IR/ROXICODONE Take 1-2 tablets (5-10 mg total) by mouth every 4 (four) hours as needed for breakthrough pain.   vitamin B-12 1000 MCG tablet Commonly known as:  CYANOCOBALAMIN Take 1,000 mcg by mouth daily.    Vitamin D3 2000 units Chew Chew 2 Doses by mouth every morning.       Diagnostic Studies: Mr Shoulder Right Wo Contrast  Result Date: 02/25/2017 CLINICAL DATA:  Right shoulder pain. Pain for 5 years. Worse this year. No known injury. EXAM: MRI OF THE RIGHT SHOULDER WITHOUT CONTRAST TECHNIQUE: Multiplanar, multisequence MR imaging of the shoulder was performed. No intravenous contrast was administered. COMPARISON:  None. FINDINGS: Rotator cuff: Mild tendinosis of the supraspinatus and infraspinatus tendons. Teres minor tendon is intact. Subscapularis tendon is intact. Muscles: No atrophy or fatty replacement of nor abnormal signal within, the muscles of the rotator cuff. Biceps long head:  Intact. Acromioclavicular Joint: Mild arthropathy of the acromioclavicular joint. Type II acromion. Moderate amount of subacromial/ subdeltoid bursal fluid. Glenohumeral Joint: Extensive full-thickness cartilage loss of the glenohumeral joint with subchondral reactive marrow edema and cystic changes in the glenoid and humeral head. Moderate joint effusion with synovitis. Labrum:  Diffuse labral degeneration. Bones: 10 mm T2 hyperintense lesion in the a chromium likely reflecting interosseous cyst or postsurgical changes. No other marrow signal abnormality. No fracture or dislocation. Other: No fluid collection or hematoma. IMPRESSION: 1. Severe advanced osteoarthritis of the right glenohumeral joint. Moderate joint effusion with synovitis. 2. Moderate subacromial/subdeltoid bursitis. 3. Mild tendinosis of the supraspinatus and infraspinatus tendons. Electronically Signed   By: Elige KoHetal  Patel   On: 02/25/2017 09:48   Dg Shoulder Right Port  Result Date: 03/26/2017 CLINICAL DATA:  Postop right shoulder surgery. EXAM: PORTABLE RIGHT SHOULDER COMPARISON:  None. FINDINGS: Right humeral hemiarthroplasty is identified. Postsurgical clips are noted over the right shoulder. IMPRESSION: Postop changes without malalignment.  Electronically Signed   By: Sherian ReinWei-Chen  Lin M.D.   On: 03/26/2017 18:28   Disposition: Plan is for discharge home this afternoon.  Follow-up Information    Tera PartridgeWolfe, Jon R, PA Follow up on 04/09/2017.   Specialty:  Physician Assistant Why:  Mindi SlickerStaple Removal. Contact information: [address] [facility] [place] [id number] [phone]          Signed: Meriel PicaJames L McGhee PA-C 03/27/2017, 8:01 AM

## 2017-03-27 NOTE — Care Management Note (Signed)
Case Management Note  Patient Details  Name: Bryan Gould MRN: [id number] Date of Birth: August 26, 1951  Subjective/Objective:  Met with patient at bedside to discuss discharge planning. Offered choice of home health agencies and explained what they would do. He states he has had surgery in the past and he doesn't like the way it works at home. He prefers to go to [place] for OP PT. Notified Morley Kos, [place].  TC to [place],  [place] PT appointment set up for June 22 at 1:30 pm. Patient updated. Placed on discharge orders.                 Action/Plan: OP PT at [facility].   Expected Discharge Date:  03/27/17               Expected Discharge Plan:  Home/Self Care  In-House Referral:     Discharge planning Services  CM Consult  Post Acute Care Choice:  Home Health Choice offered to:  Patient  DME Arranged:    DME Agency:     HH Arranged:  Patient Refused Colp Agency:     Status of Service:  Completed, signed off  If discussed at [employer] of Stay Meetings, dates discussed:    Additional Comments:  Jolly Mango, RN 03/27/2017, 9:05 AM

## 2017-03-27 NOTE — Discharge Instructions (Signed)
Diet: As you were doing prior to hospitalization   Shower:  May shower but keep the wounds dry, use an occlusive plastic wrap, NO SOAKING IN TUB.  If the bandage gets wet, change with a clean dry gauze.  Dressing:  You may change your dressing as needed for your shoulder. Change the dressing with sterile gauze dressing.    KEEP THE DRESSING ON YOUR RIGHT WRIST INTACT UNTIL FOLLOW UP APPOINTMENT.  Activity:  Increase activity slowly as tolerated, but follow the weight bearing instructions below.  No lifting or driving for 6 weeks.  Weight Bearing:   Non-weightbearing to the right arm.  Blood Clot Prevention:  Take 81mg  Aspirin twice daily for the next 14 days.  To prevent constipation: you may use a stool softener such as -  Colace (over the counter) 100 mg by mouth twice a day  Drink plenty of fluids (prune juice may be helpful) and high fiber foods Miralax (over the counter) for constipation as needed.    Itching:  If you experience itching with your medications, try taking only a single pain pill, or even half a pain pill at a time.  You may take up to 10 pain pills per day, and you can also use benadryl over the counter for itching or also to help with sleep.   Precautions:  If you experience chest pain or shortness of breath - call 911 immediately for transfer to the hospital emergency department!!  If you develop a fever greater that 101 F, purulent drainage from wound, increased redness or drainage from wound, or calf pain-Call [facility]                                              Follow- Up Appointment:  [facility]

## 2017-03-27 NOTE — Progress Notes (Signed)
Patient is alert and oriented and able to verbalize needs. No complaints of pain at this time. Discharge instructions gone over with patient. Printed AVS and hard script for oxycodone given to patient. Patient verbalizes understanding of all discharge instructions and follow up care. No concerns voiced at this time. Vital signs stable. Awaiting the arrival of wife to transport patient home.   Suzan SlickAlison L Yvonda Fouty, RN

## 2017-03-27 NOTE — Evaluation (Signed)
Physical Therapy Evaluation Patient Details Name: Bryan Gould MRN: [id number] DOB: 01-03-1951 Today's Date: 03/27/2017   History of Present Illness  66 y/o male s/p R total shoulder as well as R 5th digit contracture repair.   Clinical Impression  Pt did well with PT and should be able to safely go home once medically stable.  Issued HEP for pt, however he is even more limited than a typical total shoulder secondary to splinting of R hand after 5th digit contracture repair.  Pt safe and confident with all mobility, will need PT for R shoulder when appropriate.    Follow Up Recommendations DC plan and follow up therapy as arranged by surgeon    Equipment Recommendations       Recommendations for Other Services       Precautions / Restrictions Restrictions Weight Bearing Restrictions: Yes RUE Weight Bearing: Non weight bearing      Mobility  Bed Mobility Overal bed mobility: Independent                Transfers Overall transfer level: Independent Equipment used: None             General transfer comment: Pt able to easily get to standing with no assist and good confidence/safety  Ambulation/Gait Ambulation/Gait assistance: Independent Ambulation Distance (Feet): 250 Feet Assistive device: None       General Gait Details: Pt with consistent, confident ambulation. He had no issues with safety, fatigue, etc.  Stairs Stairs: Yes Stairs assistance: Independent Stair Management: One rail Left Number of Stairs: 4 General stair comments: Pt was able to negotiate up/down steps w/o issue, showed good confidence and safety.  Wheelchair Mobility    Modified Rankin (Stroke Patients Only)       Balance Overall balance assessment: Independent                                           Pertinent Vitals/Pain Pain Assessment: No/denies pain    Home Living Family/patient expects to be discharged to:: Private residence Living Arrangements:  Spouse/significant other;Children;Other relatives Available Help at Discharge: Family Type of Home: House Home Access: Stairs to enter Entrance Stairs-Rails: LawyerLeft;Right Entrance Stairs-Number of Steps: 4 Home Layout: Two level;Able to live on main level with bedroom/bathroom        Prior Function Level of Independence: Independent         Comments: Pt driving, active, able to do all she needed     Hand Dominance        Extremity/Trunk Assessment   Upper Extremity Assessment Upper Extremity Assessment: Overall WFL for tasks assessed (R UE in immobilizer, no limitations)    Lower Extremity Assessment Lower Extremity Assessment: Overall WFL for tasks assessed       Communication   Communication: No difficulties  Cognition Arousal/Alertness: Awake/alert Behavior During Therapy: WFL for tasks assessed/performed Overall Cognitive Status: Within Functional Limits for tasks assessed                                        General Comments      Exercises     Assessment/Plan    PT Assessment Patient needs continued PT services  PT Problem List Decreased strength;Decreased range of motion;Decreased mobility       PT Treatment Interventions Gait training;Stair  training;Functional mobility training;Therapeutic activities;Therapeutic exercise;Balance training;Neuromuscular re-education;Patient/family education    PT Goals (Current goals can be found in the Care Plan section)  Acute Rehab PT Goals Patient Stated Goal: go home PT Goal Formulation: With patient Time For Goal Achievement: 04/10/17 Potential to Achieve Goals: Good    Frequency 7X/week   Barriers to discharge        Co-evaluation               AM-PAC PT "6 Clicks" Daily Activity  Outcome Measure Difficulty turning over in bed (including adjusting bedclothes, sheets and blankets)?: None Difficulty moving from lying on back to sitting on the side of the bed? : None Difficulty  sitting down on and standing up from a chair with arms (e.g., wheelchair, bedside commode, etc,.)?: None Help needed moving to and from a bed to chair (including a wheelchair)?: None Help needed walking in hospital room?: None Help needed climbing 3-5 steps with a railing? : None 6 Click Score: 24    End of Session Equipment Utilized During Treatment: Gait belt Activity Tolerance: Patient tolerated treatment well Patient left: in chair;with call bell/phone within reach Nurse Communication: Mobility status PT Visit Diagnosis: Muscle weakness (generalized) (M62.81)    Time: 0865-7846 PT Time Calculation (min) (ACUTE ONLY): 18 min   Charges:   PT Evaluation $PT Eval Low Complexity: 1 Procedure     PT G CodesMalachi Pro, DPT 03/27/2017, 10:21 AM

## 2017-03-27 NOTE — Progress Notes (Signed)
Clinical Child psychotherapistocial Worker (CSW) received SNF consult. PT is recommending follow up with therapy as arranged by surgeon. Patient will D/C home today. RN case manager aware of above. Please reconsult if future social work needs arise. CSW signing off.   Baker Hughes IncorporatedBailey Decorey Wahlert, LCSW [phone]

## 2017-03-30 ENCOUNTER — Encounter: Payer: Self-pay | Admitting: Surgery

## 2017-03-30 LAB — SURGICAL PATHOLOGY

## 2017-05-19 IMAGING — MR MR SHOULDER*R* W/O CM
6 series · 40 of 40 positions shown · non-contrast
Comparison: None.

CLINICAL DATA: Right shoulder pain. Pain for 5 years. Worse this
year. No known injury.

EXAM:
MRI OF THE RIGHT SHOULDER WITHOUT CONTRAST
TECHNIQUE: Multiplanar, multisequence MR imaging of the shoulder was performed.
No intravenous contrast was administered.

[Series 4: T2 fat-sat · oblique · 4.0mm · 0.62mm/px · 6 of 23 slices shown (1 of 3)]
[im 1/23]
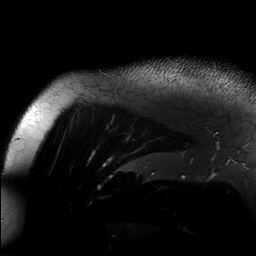
[im 5/23]
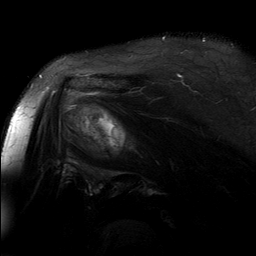
[im 9/23]
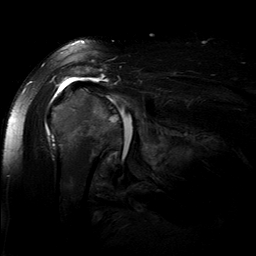
[im 14/23]
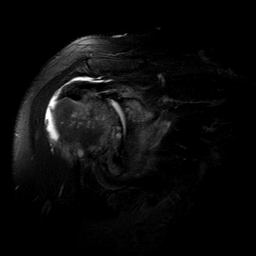
[im 18/23]
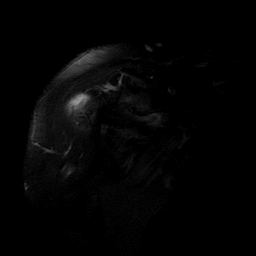
[im 23/23]
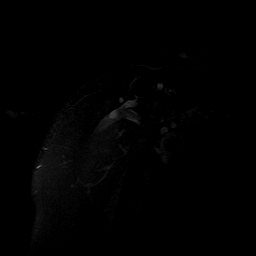

[Series 5: T2 fat-sat · axial · 4.0mm · 0.47mm/px · z∈[-49,+56]mm · 6 of 25 slices shown (2 of 3)]
[im 1/25]
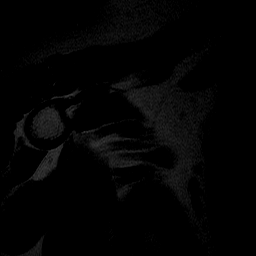
[im 5/25]
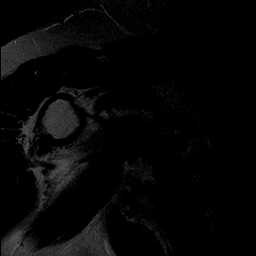
[im 10/25]
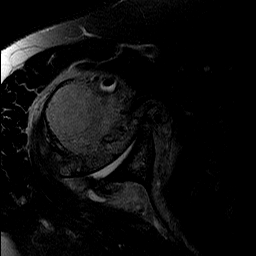
[im 15/25]
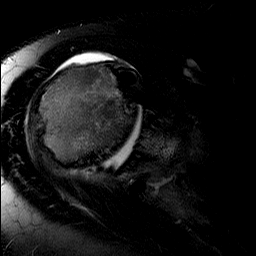
[im 20/25]
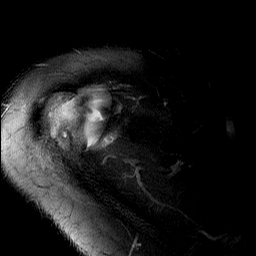
[im 25/25]
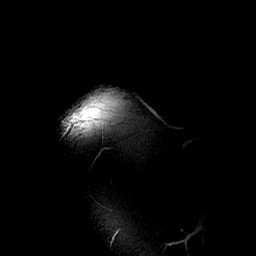

[Series 6: PD · oblique · 4.0mm · 0.62mm/px · 7 of 23 slices shown]
[im 1/23]
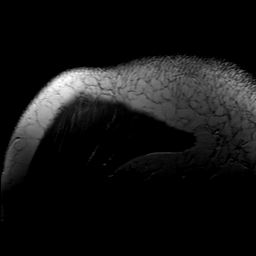
[im 4/23]
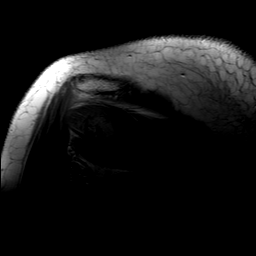
[im 8/23]
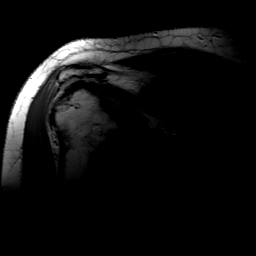
[im 12/23]
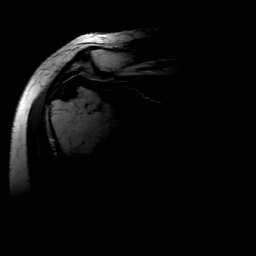
[im 15/23]
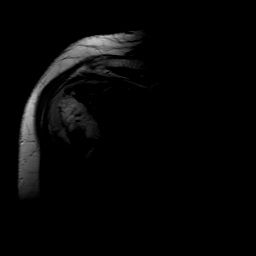
[im 19/23]
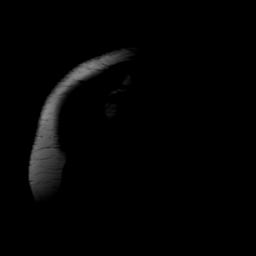
[im 23/23]
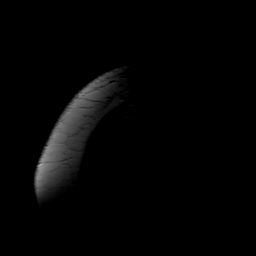

[Series 7: T1 · oblique · 4.0mm · 0.62mm/px · 7 of 23 slices shown]
[im 1/23]
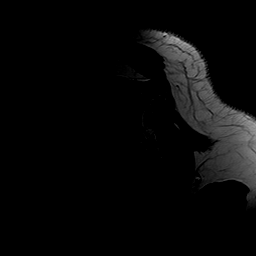
[im 4/23]
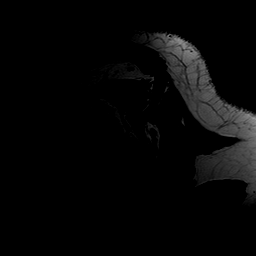
[im 8/23]
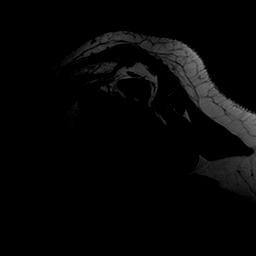
[im 12/23]
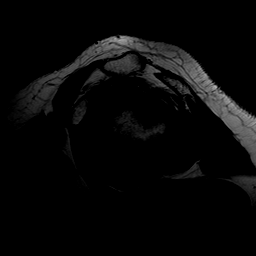
[im 15/23]
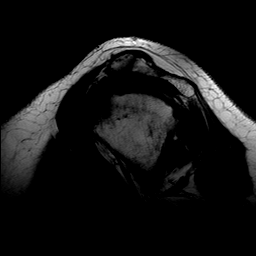
[im 19/23]
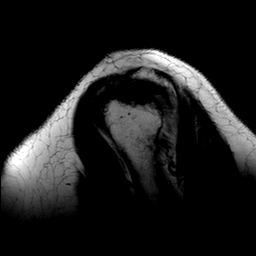
[im 23/23]
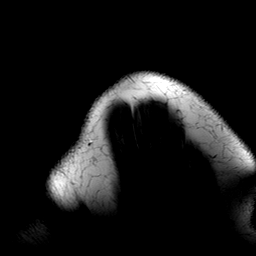

[Series 8: T2 fat-sat · oblique · 4.0mm · 0.62mm/px · 7 of 23 slices shown (3 of 3)]
[im 1/23]
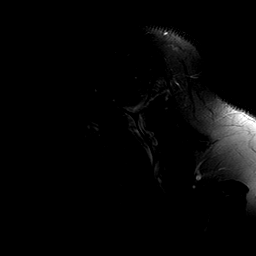
[im 4/23]
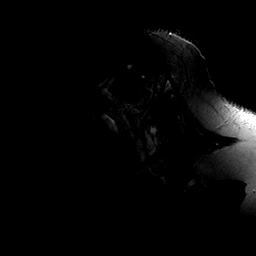
[im 8/23]
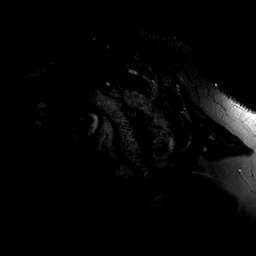
[im 12/23]
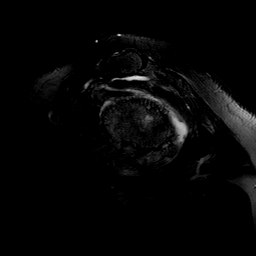
[im 15/23]
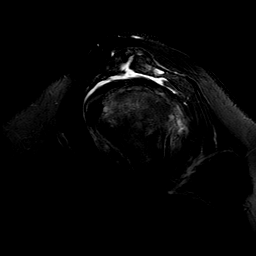
[im 19/23]
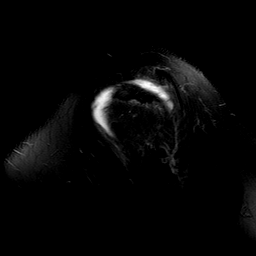
[im 23/23]
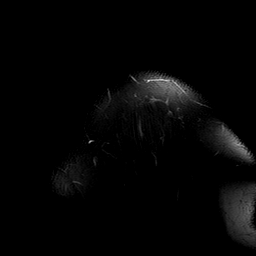

[Series 9: STIR · oblique · 4.0mm · 0.62mm/px · 7 of 23 slices shown]
[im 1/23]
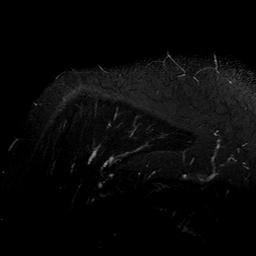
[im 4/23]
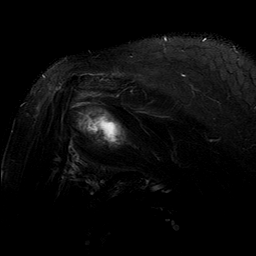
[im 8/23]
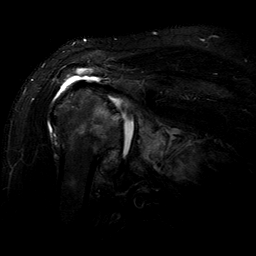
[im 12/23]
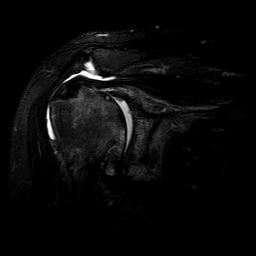
[im 15/23]
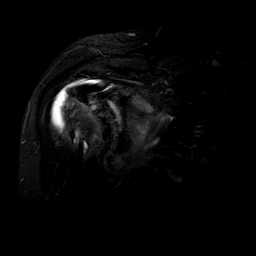
[im 19/23]
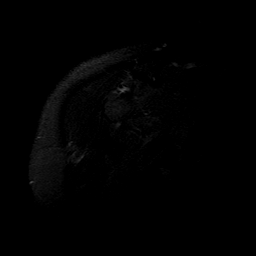
[im 23/23]
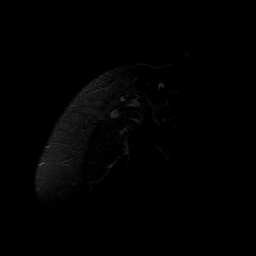

[40 of 40 positions shown; findings below may reference images not displayed]

FINDINGS: Rotator cuff: Mild tendinosis of the supraspinatus and infraspinatus
tendons. Teres minor tendon is intact. Subscapularis tendon is
intact.

Muscles: No atrophy or fatty replacement of nor abnormal signal
within, the muscles of the rotator cuff.

Biceps long head:  Intact.

Acromioclavicular Joint: Mild arthropathy of the acromioclavicular
joint. Type II acromion. Moderate amount of subacromial/ subdeltoid
bursal fluid.

Glenohumeral Joint: Extensive full-thickness cartilage loss of the
glenohumeral joint with subchondral reactive marrow edema and cystic
changes in the glenoid and humeral head. Moderate joint effusion
with synovitis.

Labrum:  Diffuse labral degeneration.

Bones: 10 mm T2 hyperintense lesion in the a chromium likely
reflecting interosseous cyst or postsurgical changes. No other
marrow signal abnormality. No fracture or dislocation.

Other: No fluid collection or hematoma.
IMPRESSION: 1. Severe advanced osteoarthritis of the right glenohumeral joint.
Moderate joint effusion with synovitis.
2. Moderate subacromial/subdeltoid bursitis.
3. Mild tendinosis of the supraspinatus and infraspinatus tendons.

## 2018-07-30 ENCOUNTER — Emergency Department: Payer: Medicare Other

## 2018-07-30 ENCOUNTER — Other Ambulatory Visit: Payer: Self-pay

## 2018-07-30 ENCOUNTER — Emergency Department
Admission: EM | Admit: 2018-07-30 | Discharge: 2018-07-30 | Disposition: A | Discharge status: Home / Self Care | Payer: Medicare Other | Attending: Emergency Medicine | Admitting: Emergency Medicine

## 2018-07-30 DIAGNOSIS — F17210 Nicotine dependence, cigarettes, uncomplicated: Secondary | ICD-10-CM | POA: Diagnosis not present

## 2018-07-30 DIAGNOSIS — M25512 Pain in left shoulder: Secondary | ICD-10-CM | POA: Diagnosis not present

## 2018-07-30 DIAGNOSIS — R0789 Other chest pain: Secondary | ICD-10-CM

## 2018-07-30 DIAGNOSIS — Z79899 Other long term (current) drug therapy: Secondary | ICD-10-CM | POA: Insufficient documentation

## 2018-07-30 DIAGNOSIS — R011 Cardiac murmur, unspecified: Secondary | ICD-10-CM | POA: Insufficient documentation

## 2018-07-30 DIAGNOSIS — R001 Bradycardia, unspecified: Secondary | ICD-10-CM | POA: Diagnosis not present

## 2018-07-30 LAB — COMPREHENSIVE METABOLIC PANEL
ALK PHOS: 80 U/L (ref 38–126)
ALT: 21 U/L (ref 0–44)
AST: 30 U/L (ref 15–41)
Albumin: 3.9 g/dL (ref 3.5–5.0)
Anion gap: 10 (ref 5–15)
BILIRUBIN TOTAL: 0.7 mg/dL (ref 0.3–1.2)
BUN: 28 mg/dL — ABNORMAL HIGH (ref 8–23)
CALCIUM: 9 mg/dL (ref 8.9–10.3)
CO2: 26 mmol/L (ref 22–32)
CREATININE: 0.75 mg/dL (ref 0.61–1.24)
Chloride: 106 mmol/L (ref 98–111)
Glucose, Bld: 83 mg/dL (ref 70–99)
Potassium: 4.3 mmol/L (ref 3.5–5.1)
SODIUM: 142 mmol/L (ref 135–145)
TOTAL PROTEIN: 7 g/dL (ref 6.5–8.1)

## 2018-07-30 LAB — CBC
HCT: 39.7 % (ref 39.0–52.0)
HEMOGLOBIN: 12.8 g/dL — AB (ref 13.0–17.0)
MCH: 29.6 pg (ref 26.0–34.0)
MCHC: 32.2 g/dL (ref 30.0–36.0)
MCV: 91.7 fL (ref 80.0–100.0)
Platelets: 239 10*3/uL (ref 150–400)
RBC: 4.33 MIL/uL (ref 4.22–5.81)
RDW: 13.1 % (ref 11.5–15.5)
WBC: 6.2 10*3/uL (ref 4.0–10.5)
nRBC: 0 % (ref 0.0–0.2)

## 2018-07-30 LAB — TROPONIN I: Troponin I: <0.03 ng/mL (ref <0.03)

## 2018-07-30 MED ORDER — DICLOFENAC SODIUM 1 % TD GEL: 4.0000 g | Freq: Four times a day (QID) | TRANSDERMAL | 0 refills | Status: AC | PRN

## 2018-07-30 MED ORDER — IBUPROFEN 600 MG PO TABS
600.0000 mg | ORAL_TABLET | Freq: Once | ORAL | Status: AC
  Administered 2018-07-30: 600 mg via ORAL
  Filled 2018-07-30: qty 1

## 2018-07-30 NOTE — Discharge Instructions (Signed)
Fortunately today your chest x-ray, your EKG, and your blood work were reassuring.  Please keep your appointment this coming Monday for your echocardiogram of your heart but I also think is a good idea to follow-up with the cardiologist within a week for a recheck as well.  Return to the emergency department for any new or worsening symptoms such as worsening pain, fevers, chills, or for any other issues whatsoever.  It was a pleasure to take care of you today, and thank you for coming to our emergency department.  If you have any questions or concerns before leaving please ask the nurse to grab me and I'm more than happy to go through your aftercare instructions again.  If you were prescribed any opioid pain medication today such as Norco, Vicodin, Percocet, morphine, hydrocodone, or oxycodone please make sure you do not drive when you are taking this medication as it can alter your ability to drive safely.  If you have any concerns once you are home that you are not improving or are in fact getting worse before you can make it to your follow-up appointment, please do not hesitate to call 911 and come back for further evaluation.  Merrily Brittle, MD  Results for orders placed or performed during the hospital encounter of 07/30/18  CBC  Result Value Ref Range   WBC 6.2 4.0 - 10.5 K/uL   RBC 4.33 4.22 - 5.81 MIL/uL   Hemoglobin 12.8 (L) 13.0 - 17.0 g/dL   HCT 16.1 09.6 - 04.5 %   MCV 91.7 80.0 - 100.0 fL   MCH 29.6 26.0 - 34.0 pg   MCHC 32.2 30.0 - 36.0 g/dL   RDW 40.9 81.1 - 91.4 %   Platelets 239 150 - 400 K/uL   nRBC 0.0 0.0 - 0.2 %  Comprehensive metabolic panel  Result Value Ref Range   Sodium 142 135 - 145 mmol/L   Potassium 4.3 3.5 - 5.1 mmol/L   Chloride 106 98 - 111 mmol/L   CO2 26 22 - 32 mmol/L   Glucose, Bld 83 70 - 99 mg/dL   BUN 28 (H) 8 - 23 mg/dL   Creatinine, Ser 7.82 0.61 - 1.24 mg/dL   Calcium 9.0 8.9 - 95.6 mg/dL   Total Protein 7.0 6.5 - 8.1 g/dL   Albumin 3.9  3.5 - 5.0 g/dL   AST 30 15 - 41 U/L   ALT 21 0 - 44 U/L   Alkaline Phosphatase 80 38 - 126 U/L   Total Bilirubin 0.7 0.3 - 1.2 mg/dL   GFR calc non Af Amer >60 >60 mL/min   GFR calc Af Amer >60 >60 mL/min   Anion gap 10 5 - 15  Troponin I  Result Value Ref Range   Troponin I <0.03 <0.03 ng/mL   Dg Chest 2 View  Result Date: 07/30/2018 CLINICAL DATA:  Chest pain EXAM: CHEST - 2 VIEW COMPARISON:  CT 08/24/2009 FINDINGS: Bilateral shoulder replacements. No focal opacity or pleural effusion. Cardiomediastinal silhouette within normal limits. No pneumothorax. Mild wedging of mid to lower thoracic vertebra IMPRESSION: No active cardiopulmonary disease. Electronically Signed   By: Jasmine Pang M.D.   On: 07/30/2018 03:18

## 2018-07-30 NOTE — ED Triage Notes (Signed)
Pt in with co chest pain that started at 2200 yest with some shob. Pt denies any cardiac hx, no distress noted at this time.

## 2018-07-30 NOTE — ED Provider Notes (Signed)
[facility] Emergency Department Provider Note  ____________________________________________   First MD Initiated Contact with Patient 07/30/18 [phone]     (approximate)  I have reviewed the triage vital signs and the nursing notes.   HISTORY  Chief Complaint Chest Pain   HPI Bryan Gould is a 67 y.o. male who comes to the emergency department with left lateral chest pain and left shoulder pain that began at 10 PM yesterday roughly 5 hours prior to arrival.  Pain is sharp and aching.  Worse when ranging his left shoulder or pressing on his left chest.  Nonexertional.  No shortness of breath.  He notes no rash.  It is not in a linear distribution.  No leg swelling.  No recent surgery travel or immobilization.  He has no cardiac history.  The pain is sharp and aching.  It is not ripping or tearing it does not go straight to his back.  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    Past Medical History:  Diagnosis Date  . Arthritis   . GERD (gastroesophageal reflux disease)     Patient Active Problem List   Diagnosis Date Noted  . 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 COLONOSCOPY    . 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 KNEE ARTHROSCOPY Left   . LUMBAR DISC SURGERY  05/2011   L2-3, 3-4,4-5 by Dr. Gerrit Heck  . POLYPECTOMY    . TOTAL HIP ARTHROPLASTY Bilateral   . TOTAL SHOULDER ARTHROPLASTY Right 03/26/2017   Procedure: TOTAL SHOULDER ARTHROPLASTY;  Surgeon: Christena Flake, MD;  Location: [facility] ORS;  Service: Orthopedics;  Laterality: Right;  . TOTAL SHOULDER REPLACEMENT Left     Prior to Admission medications   Medication Sig Start Date End Date Taking?  Authorizing Provider  Cholecalciferol (VITAMIN D3) 2000 units CHEW Chew 2 Doses by mouth every morning.    [provider]  diclofenac sodium (VOLTAREN) 1 % GEL Apply 4 g topically 4 (four) times daily as needed (pain). 07/30/18   Merrily Brittle, MD  ferrous sulfate 325 (65 FE) MG tablet Take 325 mg by mouth daily with breakfast.    [provider]  gabapentin (NEURONTIN) 300 MG capsule Take 300 mg by mouth 3 (three) times daily.    [provider]  meloxicam (MOBIC) 7.5 MG tablet Take 7.5 mg by mouth 2 (two) times daily.    [provider]  omeprazole (PRILOSEC) 20 MG capsule Take 20 mg by mouth 2 (two) times daily before a meal.    [provider]  oxyCODONE (OXY IR/ROXICODONE) 5 MG immediate release tablet Take 1-2 tablets (5-10 mg total) by mouth every 4 (four) hours as needed for breakthrough pain. 03/27/17   Anson [place], PA-C  vitamin B-12 (CYANOCOBALAMIN) 1000 MCG tablet Take 1,000 mcg by mouth daily.    [provider]    Allergies Patient has no known allergies.  No family history on file.  Social History Social History   Tobacco Use  . Smoking status: Light Tobacco Smoker    Types: Cigarettes  . Smokeless tobacco: Never Used  . Tobacco comment: 2 cigarettes / day  Substance Use Topics  . Alcohol use: Yes  Alcohol/week: 7.0 standard drinks    Types: 7 Cans of beer per week  . Drug use: No    Review of Systems Constitutional: No fever/chills Eyes: No visual changes. ENT: No sore throat. Cardiovascular: Positive for chest pain. Respiratory: Denies shortness of breath. Gastrointestinal: No abdominal pain.  No nausea, no vomiting.  No diarrhea.  No constipation. Genitourinary: Negative for dysuria. Musculoskeletal: Positive for shoulder pain Skin: Negative for rash. Neurological: Negative for headaches, focal weakness or numbness.   ____________________________________________   PHYSICAL EXAM:  VITAL  SIGNS: ED Triage Vitals [07/30/18 0252]  Enc Vitals Group     BP 139/69     Pulse Rate (!) 55     Resp 18     Temp (!) 97.5 F (36.4 C)     Temp Source Oral     SpO2 100 %     Weight 170 lb (77.1 kg)     Height 5\' 8"  (1.727 m)     Head Circumference      Peak Flow      Pain Score 5     Pain Loc      Pain Edu?      Excl. in GC?     Constitutional: Alert and oriented x4 pleasant cooperative somewhat anxious appearing but nontoxic Eyes: PERRL EOMI. Head: Atraumatic. Nose: No congestion/rhinnorhea. Mouth/Throat: No trismus Neck: No stridor.   Cardiovascular: Bradycardic rate, regular rhythm. 2/6 systolic murmur Respiratory: Normal respiratory effort.  No retractions. Lungs CTAB and moving good air Gastrointestinal: Soft nontender Musculoskeletal: No lower extremity edema full range of motion bilateral shoulders with no focal bony tenderness Neurologic:  Normal speech and language. No gross focal neurologic deficits are appreciated. Skin:  Skin is warm, dry and intact. No rash noted. Psychiatric: Mood and affect are normal. Speech and behavior are normal.    ____________________________________________   DIFFERENTIAL includes but not limited to  Muscle strain, acute coronary syndrome, pulmonary embolism, pericarditis, shingles ____________________________________________   LABS (all labs ordered are listed, but only abnormal results are displayed)  Labs Reviewed  CBC - Abnormal; Notable for the following components:      Result Value   Hemoglobin 12.8 (*)    All other components within normal limits  COMPREHENSIVE METABOLIC PANEL - Abnormal; Notable for the following components:   BUN 28 (*)    All other components within normal limits  TROPONIN I  TROPONIN I    Lab work reviewed by me with no signs of acute ischemia x2 __________________________________________  EKG  ED ECG REPORT I, Merrily Brittle, the attending physician, personally viewed and interpreted  this ECG.  Date: 08/01/2018 EKG Time:  Rate: 54 Rhythm: Sinus bradycardia QRS Axis: normal Intervals: normal ST/T Wave abnormalities: normal Narrative Interpretation: no evidence of acute ischemia  ____________________________________________  RADIOLOGY  Chest x-ray reviewed by me with no acute disease ____________________________________________   PROCEDURES  Procedure(s) performed: no  Procedures  Critical Care performed: no  ____________________________________________   INITIAL IMPRESSION / ASSESSMENT AND PLAN / ED COURSE  Pertinent labs & imaging results that were available during my care of the patient were reviewed by me and considered in my medical decision making (see chart for details).   As part of my medical decision making, I reviewed the following data within the electronic MEDICAL RECORD NUMBER History obtained from family if available, nursing notes, old chart and ekg, as well as notes from prior ED visits.  The patient comes to the emergency department with atypical left lateral  chest and left shoulder pain.  He does have a long-standing history of arthritis and shoulder replacement on the left.  EKG is nonischemic and reassuring.  He was recently found to have a systolic murmur by his primary care physician and has an echocardiogram scheduled this coming week.  2 troponins are negative and the patient feels improved after ibuprofen.  I do try to avoid oral nonsteroidals in people of this age so I will prescribe him Voltaren gel instead and he is discharged home in improved condition.      ____________________________________________   FINAL CLINICAL IMPRESSION(S) / ED DIAGNOSES  Final diagnoses:  Atypical chest pain      NEW MEDICATIONS STARTED DURING THIS VISIT:  Discharge Medication List as of 07/30/2018  6:36 AM    START taking these medications   Details  diclofenac sodium (VOLTAREN) 1 % GEL Apply 4 g topically 4 (four) times daily as needed  (pain)., Starting Fri 07/30/2018, Print         Note:  This document was prepared using Dragon voice recognition software and may include unintentional dictation errors.     Merrily Brittle, MD 08/01/18 [phone]

## 2018-10-21 IMAGING — CR DG CHEST 2V
2 series · 2 of 2 positions shown · non-contrast
Comparison: CT 08/24/2009

CLINICAL DATA: Chest pain

EXAM:
CHEST - 2 VIEW

[chest pa]
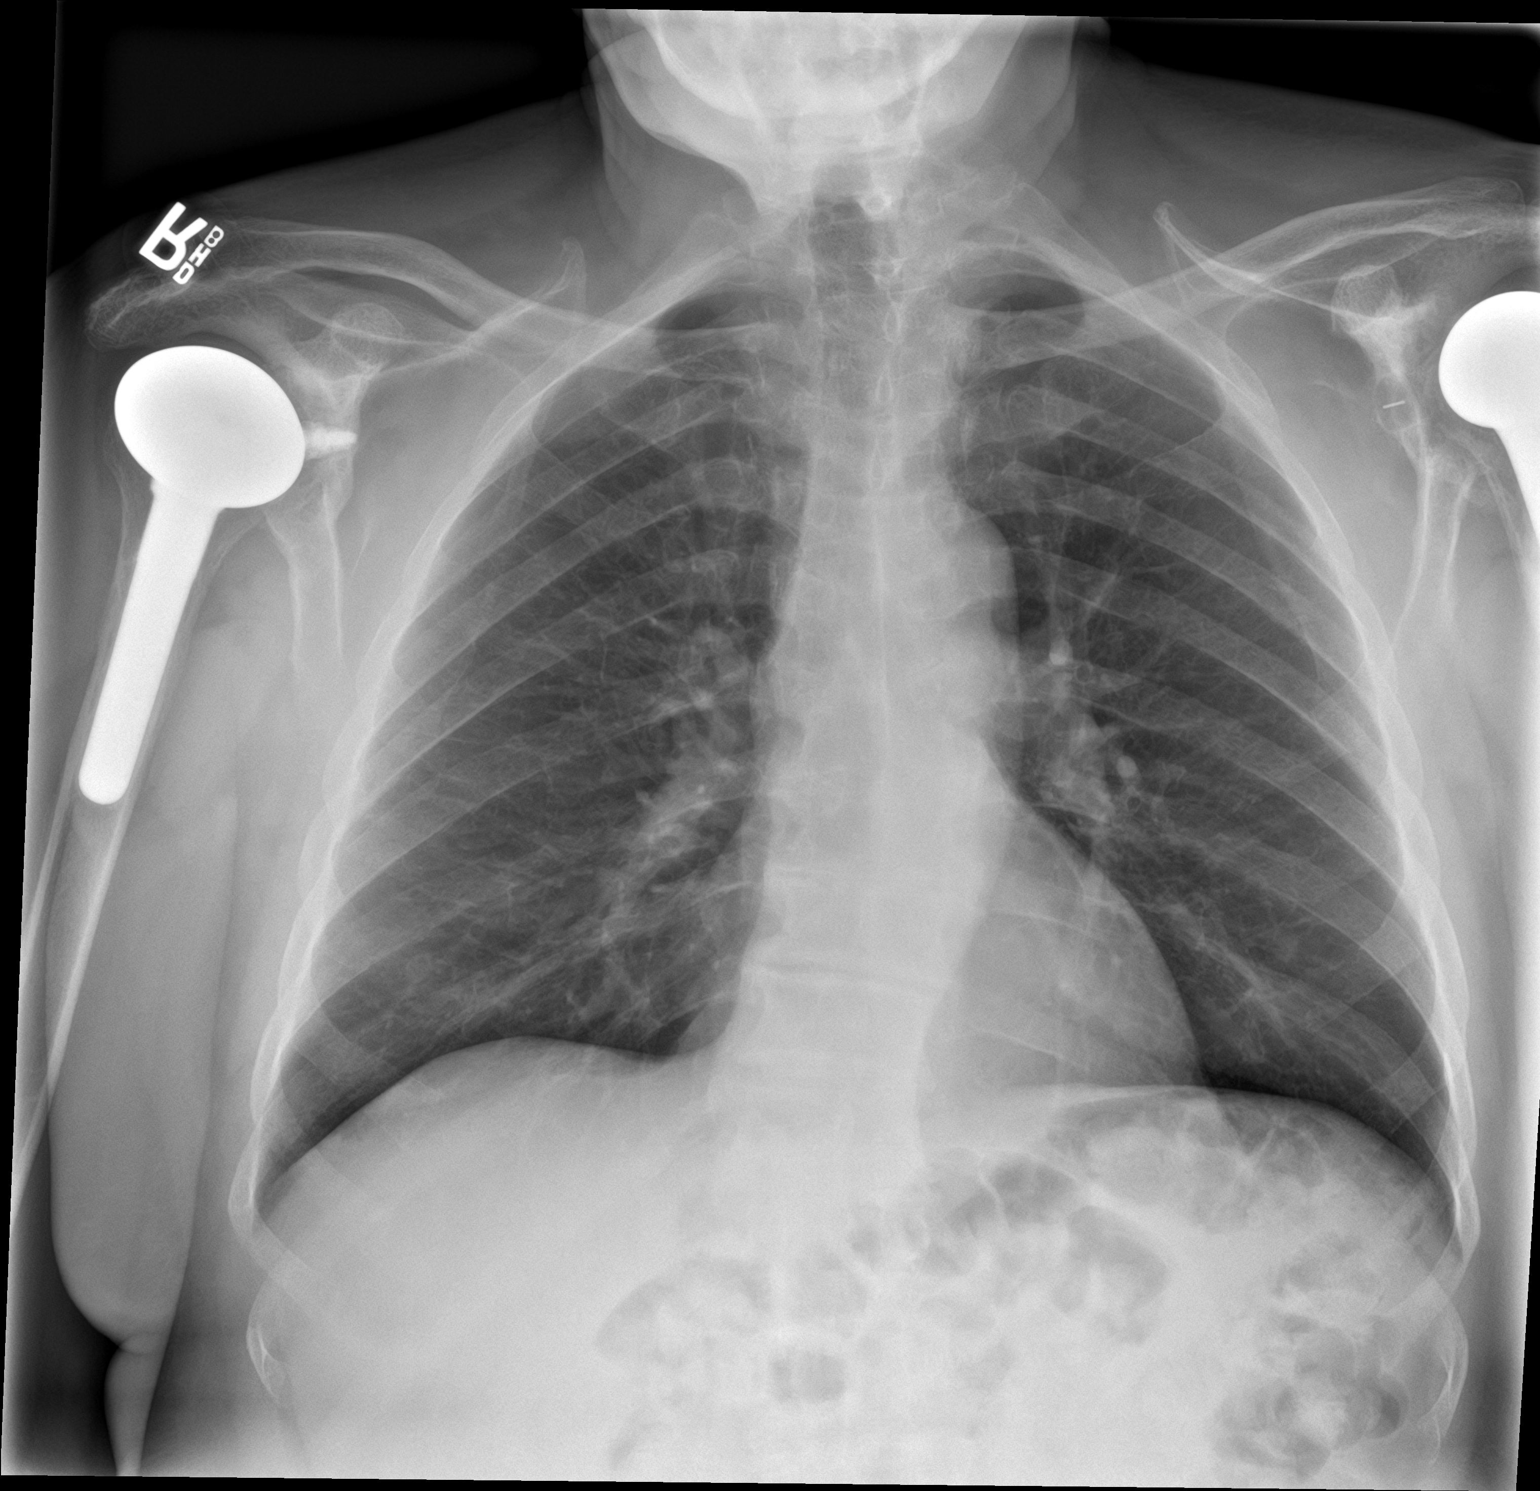

[chest lat]
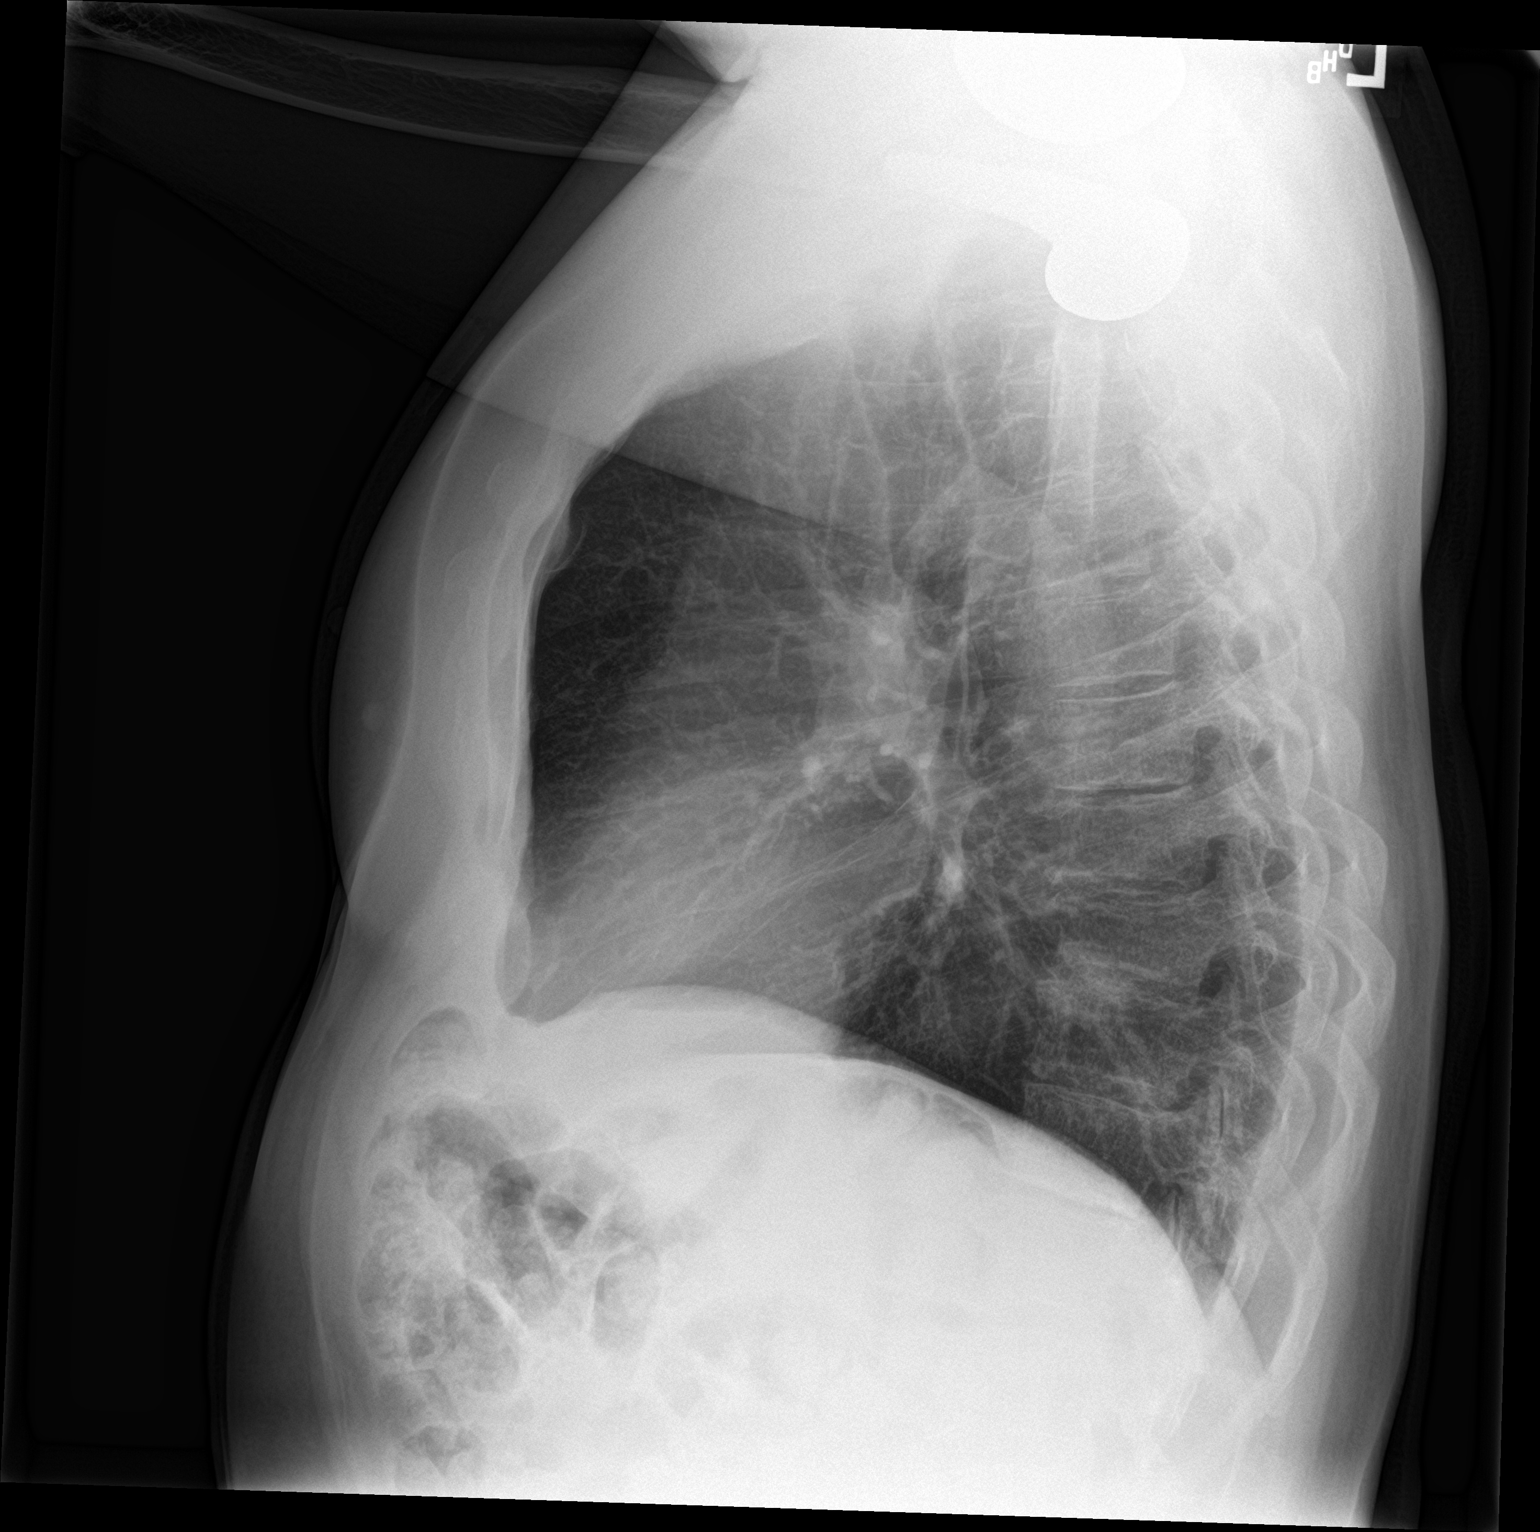

[2 of 2 positions shown; findings below may reference images not displayed]

FINDINGS: Bilateral shoulder replacements. No focal opacity or pleural
effusion. Cardiomediastinal silhouette within normal limits. No
pneumothorax. Mild wedging of mid to lower thoracic vertebra
IMPRESSION: No active cardiopulmonary disease.

## 2021-11-12 ENCOUNTER — Other Ambulatory Visit: Payer: Self-pay

## 2021-12-05 ENCOUNTER — Telehealth: Payer: Self-pay

## 2021-12-05 NOTE — Telephone Encounter (Signed)
Patients wife called regarding an appt that is scheduled for him. I verified the information to her and will mail a reminder letter to them.

## 2021-12-30 ENCOUNTER — Telehealth: Payer: Self-pay

## 2021-12-30 ENCOUNTER — Ambulatory Visit: Payer: Medicare Other | Admitting: Gastroenterology

## 2021-12-30 NOTE — Telephone Encounter (Signed)
-----   Message from Midge Minium, MD sent at 12/29/2021 10:44 AM EDT ----- ?This patient was seen at [facility] by a GI doctor there and had an upper endoscopy on April 23 less than a month ago.  I called the patient's wife and told her that she should follow up with her husband at [facility] where they did the procedure and there is no need for him to have multiple gastroenterologists. ? ?

## 2024-11-18 ENCOUNTER — Ambulatory Visit: Admission: RE | Admit: 2024-11-18 | Source: Home / Self Care

## 2024-11-18 ENCOUNTER — Ambulatory Visit: Admission: RE | Admit: 2024-11-18 | Source: Ambulatory Visit

## 2024-11-18 ENCOUNTER — Other Ambulatory Visit: Payer: Self-pay

## 2024-11-18 DIAGNOSIS — M79671 Pain in right foot: Secondary | ICD-10-CM
